# Patient Record
Sex: Male | Born: 1950 | Race: White | Hispanic: No | Marital: Married | Smoking: Never smoker
Health system: Southern US, Community
[De-identification: ages and names within clinical notes are randomized; demographics above are authoritative.]

## PROBLEM LIST (undated history)

## (undated) DIAGNOSIS — R918 Other nonspecific abnormal finding of lung field: Secondary | ICD-10-CM

## (undated) DIAGNOSIS — M5135 Other intervertebral disc degeneration, thoracolumbar region: Secondary | ICD-10-CM

## (undated) DIAGNOSIS — I82409 Acute embolism and thrombosis of unspecified deep veins of unspecified lower extremity: Secondary | ICD-10-CM

## (undated) DIAGNOSIS — G4733 Obstructive sleep apnea (adult) (pediatric): Secondary | ICD-10-CM

## (undated) DIAGNOSIS — I951 Orthostatic hypotension: Secondary | ICD-10-CM

## (undated) DIAGNOSIS — M549 Dorsalgia, unspecified: Secondary | ICD-10-CM

## (undated) DIAGNOSIS — M5412 Radiculopathy, cervical region: Secondary | ICD-10-CM

## (undated) DIAGNOSIS — G4730 Sleep apnea, unspecified: Secondary | ICD-10-CM

## (undated) DIAGNOSIS — R7309 Other abnormal glucose: Secondary | ICD-10-CM

## (undated) DIAGNOSIS — Z87438 Personal history of other diseases of male genital organs: Secondary | ICD-10-CM

## (undated) DIAGNOSIS — Z86718 Personal history of other venous thrombosis and embolism: Secondary | ICD-10-CM

## (undated) DIAGNOSIS — I739 Peripheral vascular disease, unspecified: Secondary | ICD-10-CM

## (undated) DIAGNOSIS — J189 Pneumonia, unspecified organism: Secondary | ICD-10-CM

## (undated) DIAGNOSIS — N401 Enlarged prostate with lower urinary tract symptoms: Secondary | ICD-10-CM

## (undated) DIAGNOSIS — R319 Hematuria, unspecified: Secondary | ICD-10-CM

## (undated) DIAGNOSIS — H8193 Unspecified disorder of vestibular function, bilateral: Secondary | ICD-10-CM

## (undated) DIAGNOSIS — Z974 Presence of external hearing-aid: Secondary | ICD-10-CM

## (undated) DIAGNOSIS — Z87442 Personal history of urinary calculi: Secondary | ICD-10-CM

## (undated) DIAGNOSIS — Z973 Presence of spectacles and contact lenses: Secondary | ICD-10-CM

## (undated) DIAGNOSIS — N400 Enlarged prostate without lower urinary tract symptoms: Secondary | ICD-10-CM

## (undated) DIAGNOSIS — E785 Hyperlipidemia, unspecified: Secondary | ICD-10-CM

## (undated) DIAGNOSIS — N419 Inflammatory disease of prostate, unspecified: Secondary | ICD-10-CM

## (undated) DIAGNOSIS — Z8489 Family history of other specified conditions: Secondary | ICD-10-CM

## (undated) DIAGNOSIS — Z7901 Long term (current) use of anticoagulants: Secondary | ICD-10-CM

## (undated) DIAGNOSIS — M1990 Unspecified osteoarthritis, unspecified site: Secondary | ICD-10-CM

## (undated) HISTORY — DX: Benign prostatic hyperplasia without lower urinary tract symptoms: N40.0

## (undated) HISTORY — DX: Dorsalgia, unspecified: M54.9

## (undated) HISTORY — DX: Hyperlipidemia, unspecified: E78.5

## (undated) HISTORY — PX: CERVICAL SPINE SURGERY: SHX589

## (undated) HISTORY — DX: Radiculopathy, cervical region: M54.12

## (undated) HISTORY — PX: BACK SURGERY: SHX140

## (undated) HISTORY — PX: CYSTOSCOPY W/ URETERAL STENT PLACEMENT: SHX1429

## (undated) HISTORY — PX: OTHER SURGICAL HISTORY: SHX169

## (undated) HISTORY — DX: Acute embolism and thrombosis of unspecified deep veins of unspecified lower extremity: I82.409

## (undated) HISTORY — DX: Other abnormal glucose: R73.09

---

## 1999-02-26 HISTORY — PX: LUMBAR FUSION: SHX111

## 2004-03-28 HISTORY — PX: LUMBAR DISC SURGERY: SHX700

## 2008-03-28 HISTORY — PX: ANTERIOR CERVICAL DECOMP/DISCECTOMY FUSION: SHX1161

## 2016-05-23 DIAGNOSIS — M4696 Unspecified inflammatory spondylopathy, lumbar region: Secondary | ICD-10-CM | POA: Diagnosis not present

## 2016-05-23 DIAGNOSIS — Z23 Encounter for immunization: Secondary | ICD-10-CM | POA: Diagnosis not present

## 2016-05-23 DIAGNOSIS — N400 Enlarged prostate without lower urinary tract symptoms: Secondary | ICD-10-CM | POA: Diagnosis not present

## 2016-05-23 DIAGNOSIS — Z79899 Other long term (current) drug therapy: Secondary | ICD-10-CM | POA: Diagnosis not present

## 2016-05-23 DIAGNOSIS — Z7901 Long term (current) use of anticoagulants: Secondary | ICD-10-CM | POA: Diagnosis not present

## 2016-05-23 DIAGNOSIS — I82439 Acute embolism and thrombosis of unspecified popliteal vein: Secondary | ICD-10-CM | POA: Diagnosis not present

## 2016-06-22 DIAGNOSIS — L578 Other skin changes due to chronic exposure to nonionizing radiation: Secondary | ICD-10-CM | POA: Diagnosis not present

## 2016-06-22 DIAGNOSIS — D224 Melanocytic nevi of scalp and neck: Secondary | ICD-10-CM | POA: Diagnosis not present

## 2016-06-22 DIAGNOSIS — X32XXXS Exposure to sunlight, sequela: Secondary | ICD-10-CM | POA: Diagnosis not present

## 2016-06-22 DIAGNOSIS — L821 Other seborrheic keratosis: Secondary | ICD-10-CM | POA: Diagnosis not present

## 2016-06-22 DIAGNOSIS — D225 Melanocytic nevi of trunk: Secondary | ICD-10-CM | POA: Diagnosis not present

## 2016-06-22 DIAGNOSIS — D485 Neoplasm of uncertain behavior of skin: Secondary | ICD-10-CM | POA: Diagnosis not present

## 2016-08-18 DIAGNOSIS — Z7901 Long term (current) use of anticoagulants: Secondary | ICD-10-CM | POA: Diagnosis not present

## 2016-08-18 DIAGNOSIS — R351 Nocturia: Secondary | ICD-10-CM | POA: Diagnosis not present

## 2016-08-18 DIAGNOSIS — M47896 Other spondylosis, lumbar region: Secondary | ICD-10-CM | POA: Diagnosis not present

## 2016-08-18 DIAGNOSIS — M4696 Unspecified inflammatory spondylopathy, lumbar region: Secondary | ICD-10-CM | POA: Diagnosis not present

## 2016-08-18 DIAGNOSIS — M47812 Spondylosis without myelopathy or radiculopathy, cervical region: Secondary | ICD-10-CM | POA: Diagnosis not present

## 2016-08-18 DIAGNOSIS — Z79899 Other long term (current) drug therapy: Secondary | ICD-10-CM | POA: Diagnosis not present

## 2016-08-18 DIAGNOSIS — M47816 Spondylosis without myelopathy or radiculopathy, lumbar region: Secondary | ICD-10-CM | POA: Diagnosis not present

## 2016-08-18 DIAGNOSIS — I82439 Acute embolism and thrombosis of unspecified popliteal vein: Secondary | ICD-10-CM | POA: Diagnosis not present

## 2016-11-09 DIAGNOSIS — H0015 Chalazion left lower eyelid: Secondary | ICD-10-CM | POA: Diagnosis not present

## 2016-11-23 DIAGNOSIS — M1990 Unspecified osteoarthritis, unspecified site: Secondary | ICD-10-CM | POA: Diagnosis not present

## 2016-11-23 DIAGNOSIS — Z79899 Other long term (current) drug therapy: Secondary | ICD-10-CM | POA: Diagnosis not present

## 2016-11-23 DIAGNOSIS — I82621 Acute embolism and thrombosis of deep veins of right upper extremity: Secondary | ICD-10-CM | POA: Diagnosis not present

## 2016-11-23 DIAGNOSIS — K640 First degree hemorrhoids: Secondary | ICD-10-CM | POA: Diagnosis not present

## 2016-11-23 DIAGNOSIS — K219 Gastro-esophageal reflux disease without esophagitis: Secondary | ICD-10-CM | POA: Diagnosis not present

## 2016-11-23 DIAGNOSIS — I8000 Phlebitis and thrombophlebitis of superficial vessels of unspecified lower extremity: Secondary | ICD-10-CM | POA: Diagnosis not present

## 2016-11-23 DIAGNOSIS — M4696 Unspecified inflammatory spondylopathy, lumbar region: Secondary | ICD-10-CM | POA: Diagnosis not present

## 2016-11-23 DIAGNOSIS — M47816 Spondylosis without myelopathy or radiculopathy, lumbar region: Secondary | ICD-10-CM | POA: Diagnosis not present

## 2016-11-23 DIAGNOSIS — Z7901 Long term (current) use of anticoagulants: Secondary | ICD-10-CM | POA: Diagnosis not present

## 2016-11-23 DIAGNOSIS — I82439 Acute embolism and thrombosis of unspecified popliteal vein: Secondary | ICD-10-CM | POA: Diagnosis not present

## 2016-11-23 DIAGNOSIS — M4802 Spinal stenosis, cervical region: Secondary | ICD-10-CM | POA: Diagnosis not present

## 2016-11-23 DIAGNOSIS — N400 Enlarged prostate without lower urinary tract symptoms: Secondary | ICD-10-CM | POA: Diagnosis not present

## 2016-12-24 DIAGNOSIS — R319 Hematuria, unspecified: Secondary | ICD-10-CM | POA: Diagnosis not present

## 2016-12-24 DIAGNOSIS — N390 Urinary tract infection, site not specified: Secondary | ICD-10-CM | POA: Diagnosis not present

## 2016-12-24 DIAGNOSIS — Z7901 Long term (current) use of anticoagulants: Secondary | ICD-10-CM | POA: Diagnosis not present

## 2016-12-24 DIAGNOSIS — I82621 Acute embolism and thrombosis of deep veins of right upper extremity: Secondary | ICD-10-CM | POA: Diagnosis not present

## 2016-12-24 DIAGNOSIS — N400 Enlarged prostate without lower urinary tract symptoms: Secondary | ICD-10-CM | POA: Diagnosis not present

## 2016-12-26 DIAGNOSIS — N200 Calculus of kidney: Secondary | ICD-10-CM | POA: Diagnosis not present

## 2016-12-28 DIAGNOSIS — R310 Gross hematuria: Secondary | ICD-10-CM | POA: Diagnosis not present

## 2016-12-28 DIAGNOSIS — R3912 Poor urinary stream: Secondary | ICD-10-CM | POA: Diagnosis not present

## 2016-12-28 DIAGNOSIS — N400 Enlarged prostate without lower urinary tract symptoms: Secondary | ICD-10-CM | POA: Diagnosis not present

## 2016-12-28 DIAGNOSIS — R319 Hematuria, unspecified: Secondary | ICD-10-CM | POA: Diagnosis not present

## 2017-11-09 DIAGNOSIS — N400 Enlarged prostate without lower urinary tract symptoms: Secondary | ICD-10-CM | POA: Diagnosis not present

## 2017-11-09 DIAGNOSIS — Z86718 Personal history of other venous thrombosis and embolism: Secondary | ICD-10-CM | POA: Diagnosis not present

## 2017-11-09 DIAGNOSIS — M5412 Radiculopathy, cervical region: Secondary | ICD-10-CM | POA: Diagnosis not present

## 2017-11-09 DIAGNOSIS — M549 Dorsalgia, unspecified: Secondary | ICD-10-CM | POA: Diagnosis not present

## 2017-11-16 ENCOUNTER — Other Ambulatory Visit: Payer: Self-pay | Admitting: Family Medicine

## 2017-11-16 DIAGNOSIS — M5412 Radiculopathy, cervical region: Secondary | ICD-10-CM

## 2017-11-29 ENCOUNTER — Ambulatory Visit
Admission: RE | Admit: 2017-11-29 | Discharge: 2017-11-29 | Disposition: A | Discharge status: Home / Self Care | Payer: Medicare Other | Source: Ambulatory Visit | Attending: Family Medicine | Admitting: Family Medicine

## 2017-11-29 DIAGNOSIS — M5412 Radiculopathy, cervical region: Secondary | ICD-10-CM

## 2017-11-29 DIAGNOSIS — M4802 Spinal stenosis, cervical region: Secondary | ICD-10-CM | POA: Diagnosis not present

## 2017-12-18 DIAGNOSIS — Z6828 Body mass index (BMI) 28.0-28.9, adult: Secondary | ICD-10-CM | POA: Diagnosis not present

## 2017-12-18 DIAGNOSIS — M5412 Radiculopathy, cervical region: Secondary | ICD-10-CM | POA: Diagnosis not present

## 2017-12-18 DIAGNOSIS — R030 Elevated blood-pressure reading, without diagnosis of hypertension: Secondary | ICD-10-CM | POA: Diagnosis not present

## 2017-12-21 DIAGNOSIS — M5412 Radiculopathy, cervical region: Secondary | ICD-10-CM | POA: Diagnosis not present

## 2018-01-01 DIAGNOSIS — M5412 Radiculopathy, cervical region: Secondary | ICD-10-CM | POA: Diagnosis not present

## 2018-01-01 DIAGNOSIS — M79602 Pain in left arm: Secondary | ICD-10-CM | POA: Diagnosis not present

## 2018-01-05 DIAGNOSIS — M79602 Pain in left arm: Secondary | ICD-10-CM | POA: Diagnosis not present

## 2018-01-05 DIAGNOSIS — M5412 Radiculopathy, cervical region: Secondary | ICD-10-CM | POA: Diagnosis not present

## 2018-01-11 DIAGNOSIS — M79602 Pain in left arm: Secondary | ICD-10-CM | POA: Diagnosis not present

## 2018-01-11 DIAGNOSIS — M5412 Radiculopathy, cervical region: Secondary | ICD-10-CM | POA: Diagnosis not present

## 2018-01-16 DIAGNOSIS — M5412 Radiculopathy, cervical region: Secondary | ICD-10-CM | POA: Diagnosis not present

## 2018-01-16 DIAGNOSIS — M79602 Pain in left arm: Secondary | ICD-10-CM | POA: Diagnosis not present

## 2018-01-18 DIAGNOSIS — M5412 Radiculopathy, cervical region: Secondary | ICD-10-CM | POA: Diagnosis not present

## 2018-01-18 DIAGNOSIS — M79602 Pain in left arm: Secondary | ICD-10-CM | POA: Diagnosis not present

## 2018-01-19 DIAGNOSIS — Z125 Encounter for screening for malignant neoplasm of prostate: Secondary | ICD-10-CM | POA: Diagnosis not present

## 2018-01-19 DIAGNOSIS — M5412 Radiculopathy, cervical region: Secondary | ICD-10-CM | POA: Diagnosis not present

## 2018-01-19 DIAGNOSIS — Z0000 Encounter for general adult medical examination without abnormal findings: Secondary | ICD-10-CM | POA: Diagnosis not present

## 2018-01-19 DIAGNOSIS — E785 Hyperlipidemia, unspecified: Secondary | ICD-10-CM | POA: Diagnosis not present

## 2018-01-19 DIAGNOSIS — Z86718 Personal history of other venous thrombosis and embolism: Secondary | ICD-10-CM | POA: Diagnosis not present

## 2018-01-19 DIAGNOSIS — Z23 Encounter for immunization: Secondary | ICD-10-CM | POA: Diagnosis not present

## 2018-01-19 DIAGNOSIS — N400 Enlarged prostate without lower urinary tract symptoms: Secondary | ICD-10-CM | POA: Diagnosis not present

## 2018-01-19 DIAGNOSIS — L309 Dermatitis, unspecified: Secondary | ICD-10-CM | POA: Diagnosis not present

## 2018-01-23 DIAGNOSIS — M79602 Pain in left arm: Secondary | ICD-10-CM | POA: Diagnosis not present

## 2018-01-23 DIAGNOSIS — M5412 Radiculopathy, cervical region: Secondary | ICD-10-CM | POA: Diagnosis not present

## 2018-01-25 DIAGNOSIS — M5412 Radiculopathy, cervical region: Secondary | ICD-10-CM | POA: Diagnosis not present

## 2018-01-25 DIAGNOSIS — M79602 Pain in left arm: Secondary | ICD-10-CM | POA: Diagnosis not present

## 2018-02-07 DIAGNOSIS — M5412 Radiculopathy, cervical region: Secondary | ICD-10-CM | POA: Diagnosis not present

## 2018-02-12 DIAGNOSIS — Z6828 Body mass index (BMI) 28.0-28.9, adult: Secondary | ICD-10-CM | POA: Diagnosis not present

## 2018-02-12 DIAGNOSIS — M5412 Radiculopathy, cervical region: Secondary | ICD-10-CM | POA: Diagnosis not present

## 2018-02-12 DIAGNOSIS — R030 Elevated blood-pressure reading, without diagnosis of hypertension: Secondary | ICD-10-CM | POA: Diagnosis not present

## 2018-02-20 DIAGNOSIS — R030 Elevated blood-pressure reading, without diagnosis of hypertension: Secondary | ICD-10-CM | POA: Diagnosis not present

## 2018-02-20 DIAGNOSIS — R079 Chest pain, unspecified: Secondary | ICD-10-CM | POA: Diagnosis not present

## 2018-03-02 DIAGNOSIS — N410 Acute prostatitis: Secondary | ICD-10-CM | POA: Diagnosis not present

## 2018-03-05 DIAGNOSIS — R509 Fever, unspecified: Secondary | ICD-10-CM | POA: Diagnosis not present

## 2018-03-10 DIAGNOSIS — R509 Fever, unspecified: Secondary | ICD-10-CM | POA: Diagnosis not present

## 2018-03-10 DIAGNOSIS — R05 Cough: Secondary | ICD-10-CM | POA: Diagnosis not present

## 2018-03-16 ENCOUNTER — Other Ambulatory Visit: Payer: Self-pay | Admitting: Family Medicine

## 2018-03-16 DIAGNOSIS — R350 Frequency of micturition: Secondary | ICD-10-CM | POA: Diagnosis not present

## 2018-03-16 DIAGNOSIS — R5383 Other fatigue: Secondary | ICD-10-CM | POA: Diagnosis not present

## 2018-03-16 DIAGNOSIS — R509 Fever, unspecified: Secondary | ICD-10-CM

## 2018-03-16 DIAGNOSIS — R05 Cough: Secondary | ICD-10-CM | POA: Diagnosis not present

## 2018-03-16 DIAGNOSIS — R059 Cough, unspecified: Secondary | ICD-10-CM

## 2018-03-19 ENCOUNTER — Ambulatory Visit
Admission: RE | Admit: 2018-03-19 | Discharge: 2018-03-19 | Disposition: A | Discharge status: Home / Self Care | Payer: Medicare Other | Source: Ambulatory Visit | Attending: Family Medicine | Admitting: Family Medicine

## 2018-03-19 ENCOUNTER — Other Ambulatory Visit: Payer: Medicare Other

## 2018-03-19 DIAGNOSIS — R059 Cough, unspecified: Secondary | ICD-10-CM

## 2018-03-19 DIAGNOSIS — R05 Cough: Secondary | ICD-10-CM

## 2018-03-19 DIAGNOSIS — K769 Liver disease, unspecified: Secondary | ICD-10-CM | POA: Diagnosis not present

## 2018-03-19 DIAGNOSIS — J90 Pleural effusion, not elsewhere classified: Secondary | ICD-10-CM | POA: Diagnosis not present

## 2018-03-19 DIAGNOSIS — R509 Fever, unspecified: Secondary | ICD-10-CM

## 2018-03-19 MED ORDER — IOPAMIDOL (ISOVUE-300) INJECTION 61%
100.0000 mL | Freq: Once | INTRAVENOUS | Status: AC | PRN
  Administered 2018-03-19: 100 mL via INTRAVENOUS

## 2018-03-22 DIAGNOSIS — R918 Other nonspecific abnormal finding of lung field: Secondary | ICD-10-CM | POA: Diagnosis not present

## 2018-03-23 ENCOUNTER — Other Ambulatory Visit: Payer: Self-pay

## 2018-03-23 ENCOUNTER — Encounter (HOSPITAL_COMMUNITY): Payer: Self-pay | Admitting: *Deleted

## 2018-03-23 ENCOUNTER — Emergency Department (HOSPITAL_COMMUNITY)
Admission: EM | Admit: 2018-03-23 | Discharge: 2018-03-24 | Disposition: A | Discharge status: Home / Self Care | Payer: Medicare Other | Attending: Emergency Medicine | Admitting: Emergency Medicine

## 2018-03-23 ENCOUNTER — Emergency Department (HOSPITAL_COMMUNITY): Payer: Medicare Other

## 2018-03-23 DIAGNOSIS — R071 Chest pain on breathing: Secondary | ICD-10-CM | POA: Diagnosis not present

## 2018-03-23 DIAGNOSIS — J181 Lobar pneumonia, unspecified organism: Secondary | ICD-10-CM | POA: Diagnosis not present

## 2018-03-23 DIAGNOSIS — J90 Pleural effusion, not elsewhere classified: Secondary | ICD-10-CM | POA: Diagnosis not present

## 2018-03-23 DIAGNOSIS — R0602 Shortness of breath: Secondary | ICD-10-CM | POA: Diagnosis not present

## 2018-03-23 DIAGNOSIS — R918 Other nonspecific abnormal finding of lung field: Secondary | ICD-10-CM | POA: Diagnosis not present

## 2018-03-23 DIAGNOSIS — R351 Nocturia: Secondary | ICD-10-CM | POA: Diagnosis not present

## 2018-03-23 DIAGNOSIS — R0781 Pleurodynia: Secondary | ICD-10-CM

## 2018-03-23 DIAGNOSIS — R3912 Poor urinary stream: Secondary | ICD-10-CM | POA: Diagnosis not present

## 2018-03-23 DIAGNOSIS — R350 Frequency of micturition: Secondary | ICD-10-CM | POA: Diagnosis not present

## 2018-03-23 HISTORY — DX: Inflammatory disease of prostate, unspecified: N41.9

## 2018-03-23 HISTORY — DX: Other nonspecific abnormal finding of lung field: R91.8

## 2018-03-23 NOTE — ED Triage Notes (Addendum)
Pt reports sharp shooting pain in his R lower side yesterday then it moved on the L side.  Today, he woke up with a severe sharp shooting pain in his R chest, which is worse with inspiration.  Denies dizziness.  He reports having a CT of his chest and was found to have nodules in bila lungs.  Pending appt with a pulmonologist.  He never smoked.  He also endorses a low-grade fever of 100.3 today.

## 2018-03-24 ENCOUNTER — Emergency Department (HOSPITAL_COMMUNITY): Payer: Medicare Other

## 2018-03-24 ENCOUNTER — Encounter (HOSPITAL_COMMUNITY): Payer: Self-pay

## 2018-03-24 DIAGNOSIS — R0602 Shortness of breath: Secondary | ICD-10-CM | POA: Diagnosis not present

## 2018-03-24 DIAGNOSIS — R0781 Pleurodynia: Secondary | ICD-10-CM | POA: Diagnosis not present

## 2018-03-24 LAB — CBC
HCT: 39.9 % (ref 39.0–52.0)
Hemoglobin: 12.8 g/dL — ABNORMAL LOW (ref 13.0–17.0)
MCH: 30.3 pg (ref 26.0–34.0)
MCHC: 32.1 g/dL (ref 30.0–36.0)
MCV: 94.5 fL (ref 80.0–100.0)
NRBC: 0 % (ref 0.0–0.2)
Platelets: 405 10*3/uL — ABNORMAL HIGH (ref 150–400)
RBC: 4.22 MIL/uL (ref 4.22–5.81)
RDW: 11.9 % (ref 11.5–15.5)
WBC: 10.2 10*3/uL (ref 4.0–10.5)

## 2018-03-24 LAB — BASIC METABOLIC PANEL
Anion gap: 9 (ref 5–15)
BUN: 20 mg/dL (ref 8–23)
CO2: 26 mmol/L (ref 22–32)
CREATININE: 1.06 mg/dL (ref 0.61–1.24)
Calcium: 9.3 mg/dL (ref 8.9–10.3)
Chloride: 98 mmol/L (ref 98–111)
GFR calc Af Amer: >60 mL/min (ref >60)
GFR calc non Af Amer: >60 mL/min (ref >60)
Glucose, Bld: 120 mg/dL — ABNORMAL HIGH (ref 70–99)
Potassium: 4.2 mmol/L (ref 3.5–5.1)
Sodium: 133 mmol/L — ABNORMAL LOW (ref 135–145)

## 2018-03-24 LAB — POCT I-STAT TROPONIN I: Troponin i, poc: 0 ng/mL (ref 0.00–0.08)

## 2018-03-24 MED ORDER — IOPAMIDOL (ISOVUE-300) INJECTION 61%: 100.0000 mL | Freq: Once | INTRAVENOUS | Status: DC | PRN

## 2018-03-24 MED ORDER — HYDROCODONE-ACETAMINOPHEN 5-325 MG PO TABS
2.0000 | ORAL_TABLET | Freq: Once | ORAL | Status: AC
  Administered 2018-03-24: 2 via ORAL
  Filled 2018-03-24: qty 2

## 2018-03-24 MED ORDER — IOPAMIDOL (ISOVUE-370) INJECTION 76%
INTRAVENOUS | Status: AC
  Filled 2018-03-24: qty 100

## 2018-03-24 MED ORDER — IOPAMIDOL (ISOVUE-370) INJECTION 76%
100.0000 mL | Freq: Once | INTRAVENOUS | Status: AC | PRN
  Administered 2018-03-24: 100 mL via INTRAVENOUS

## 2018-03-24 MED ORDER — SODIUM CHLORIDE (PF) 0.9 % IJ SOLN
INTRAMUSCULAR | Status: AC
  Filled 2018-03-24: qty 50

## 2018-03-24 MED ORDER — HYDROCODONE-ACETAMINOPHEN 5-325 MG PO TABS: 1.0000 | ORAL_TABLET | Freq: Four times a day (QID) | ORAL | 0 refills | Status: DC | PRN

## 2018-03-24 NOTE — Discharge Instructions (Addendum)
Take Norco as prescribed. Keep your scheduled appointment with pulmonology for next week. Return to the ED with any high fever, severe pain, significant shortness of breath or new concern.

## 2018-03-24 NOTE — ED Provider Notes (Signed)
[facility] DEPT Provider Note   CSN: [id number] Arrival date & time: 03/23/18  2018     History   Chief Complaint Chief Complaint  Patient presents with  . Chest Pain    HPI Craig [name] is a 67 y.o. male.  Patient to ED with pain in right and left lower chest since yesterday, worse with taking breaths. He reports a low grade fever for the past 3 weeks and a mild nonproductive cough. No abdominal pain, nausea, vomiting. He had a CT chest on 03/19/18 that show findings concerning for metastatic disease. He has his first pulmonology appointment in 3 days. No recent travel. 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   The history is provided by the patient. No language interpreter was used.  Chest Pain   Associated symptoms include cough (Mild, dry, more when taking a deep breath) and a fever (See HPI.). Pertinent negatives include no abdominal pain, no dizziness, no nausea, no shortness of breath and no weakness.    Past Medical History:  Diagnosis Date  . Multiple lung nodules on CT   . Prostatitis     There are no active problems to display for this patient.   Past Surgical History:  Procedure Laterality Date  . BACK SURGERY    . CERVICAL SPINE SURGERY    . urinary stent Left         Home Medications    Prior to Admission medications   Not on File    Family History No family history on file.  Social History Social History   Tobacco Use  . Smoking status: Never Smoker  . Smokeless tobacco: Never Used  Substance Use Topics  . Alcohol use: Yes    Comment: occa  . Drug use: Never     Allergies   Gentamycin [gentamicin]; Nsaids; and Vancomycin   Review of Systems Review of Systems  Constitutional: Positive for fever (See HPI.).  Respiratory: Positive for cough (Mild, dry, more when taking a deep breath). Negative for shortness of breath and wheezing.   Cardiovascular: Positive  for chest pain.  Gastrointestinal: Negative for abdominal pain and nausea.  Musculoskeletal: Negative for myalgias.  Neurological: Negative for dizziness, syncope and weakness.     Physical Exam Updated Vital Signs BP 133/86 (BP Location: Right Arm)   Pulse 96   Temp 99.3 F (37.4 C) (Oral)   Resp (!) 26   Ht 5\' 10"  (1.778 m)   Wt 86.2 kg   SpO2 93%   BMI 27.26 kg/m   Physical Exam Vitals signs and nursing note reviewed.  Constitutional:      Appearance: He is well-developed.  HENT:     Head: Normocephalic.  Neck:     Musculoskeletal: Normal range of motion and neck supple.  Cardiovascular:     Rate and Rhythm: Regular rhythm. Tachycardia present.     Heart sounds: No murmur.  Pulmonary:     Effort: Pulmonary effort is normal. Tachypnea present.     Breath sounds: Normal breath sounds. No decreased breath sounds, wheezing, rhonchi or rales.  Chest:     Chest wall: No tenderness.  Abdominal:     General: Bowel sounds are normal.     Palpations: Abdomen is soft.     Tenderness: There is no abdominal tenderness. There is no guarding or rebound.  Musculoskeletal: Normal range of motion.        General: No tenderness.  Right lower leg: No edema.     Left lower leg: No edema.  Skin:    General: Skin is warm and dry.     Findings: No rash.  Neurological:     Mental Status: He is alert and oriented to person, place, and time.      ED Treatments / Results  Labs (all labs ordered are listed, but only abnormal results are displayed) Labs Reviewed  BASIC METABOLIC PANEL  CBC  I-STAT TROPONIN, ED  POCT I-STAT TROPONIN I    EKG EKG Interpretation  Date/Time:  Friday March 23 2018 20:51:04 EST Ventricular Rate:  101 PR Interval:    QRS Duration: 94 QT Interval:  314 QTC Calculation: 407 R Axis:   77 Text Interpretation:  Sinus tachycardia Probable left atrial enlargement No old tracing to compare Confirmed by Duffy Bruce [phone]) on 03/24/2018  12:16:42 AM   Radiology Dg Chest 2 View  Addendum Date: 03/23/2018   ADDENDUM REPORT: 03/23/2018 23:47 ADDENDUM: The chest x-ray from today is compare with prior CT chest from March 19, 2018. The consolidation of right lung base with pleural effusion is not significantly changed compared to prior CT noted masslike area. There is at masslike lesion in the medial left lung base which is also unchanged compared to prior CT scan. Electronically Signed   By: Abelardo Diesel M.D.   On: 03/23/2018 23:47   Result Date: 03/23/2018 CLINICAL DATA:  Hervey Ard shooting right chest pain since yesterday EXAM: CHEST - 2 VIEW COMPARISON:  March 10, 2018 FINDINGS: The heart size and mediastinal contours are within normal limits. Small right pleural effusion is identified. Patchy consolidation of right lung base is noted. The left lung is clear. The visualized skeletal structures are unremarkable. IMPRESSION: Right lung base pneumonia with small right pleural effusion. Electronically Signed: By: Abelardo Diesel M.D. On: 03/23/2018 21:05    Procedures Procedures (including critical care time)  Medications Ordered in ED Medications - No data to display   Initial Impression / Assessment and Plan / ED Course  I have reviewed the triage vital signs and the nursing notes.  Pertinent labs & imaging results that were available during my care of the patient were reviewed by me and considered in my medical decision making (see chart for details).     Patient to ED for evaluation of sharp, bilateral, pleuritic, lower chest pain since yesterday. Recent CT chest finding of RLL mass, right pleural effusion and multiple nodules concerning for CA. History of DVT on Xarelto.   He appears well and in NAD. He is, however, tachycardic, tachypneic with pleuritic chest pain and history of clots. He is anticoagulated on Xarelto. Feel CTA eval for PE is reasonable given risk factors despite Xarelto use.   CTA performed and there  are no PE's present. Essentially unchanged from prior CT reading. He has had no acute fever, no leukocytosis. Doubt infectious component to chest pain. Norco provided with significant relief. He ambulates well, without difficulty or hypoxia.   Will discharge home with Rx Norco as symptoms felt related to recent finding of lung mass and nodules suspicious for CA. He has scheduled follow up next week for further evaluation and determination of definite diagnosis. He is comfortable with plan of discharge and knows to return with any new or concerning symptoms.  Final Clinical Impressions(s) / ED Diagnoses   Final diagnoses:  None   1. Chest wall pain 2. Lung mass  ED Discharge Orders    None  Charlann Lange, PA-C 03/24/18 [id number]    Duffy Bruce, MD 03/25/18 1710

## 2018-03-26 ENCOUNTER — Ambulatory Visit (INDEPENDENT_AMBULATORY_CARE_PROVIDER_SITE_OTHER): Payer: Medicare Other | Admitting: Pulmonary Disease

## 2018-03-26 ENCOUNTER — Encounter: Payer: Self-pay | Admitting: Pulmonary Disease

## 2018-03-26 VITALS — BP 106/72 | HR 90 | Ht 70.0 in | Wt 187.0 lb

## 2018-03-26 DIAGNOSIS — R911 Solitary pulmonary nodule: Secondary | ICD-10-CM | POA: Diagnosis not present

## 2018-03-26 DIAGNOSIS — R9389 Abnormal findings on diagnostic imaging of other specified body structures: Secondary | ICD-10-CM

## 2018-03-26 DIAGNOSIS — R059 Cough, unspecified: Secondary | ICD-10-CM

## 2018-03-26 DIAGNOSIS — R05 Cough: Secondary | ICD-10-CM | POA: Diagnosis not present

## 2018-03-26 NOTE — Progress Notes (Signed)
Craig [name]    [id number]    [birth year]  Primary Care Physician:Morrow, Marjory Lies, MD  Referring Physician: London Pepper, MD [address] [place], [place] [address]  Chief complaint:  Abnormal CT scan of the chest showing a right right lower lobe mass lesion, pleural-based Small nodules on the left  HPI:  Usually healthy Started with febrile illness about December 3 Treated with a course of antibiotics for possible UTI With nonresolution of symptoms and development of respiratory symptoms He was started on another course of antibiotics In total is used 3 courses of antibiotics He is no longer having fevers but still feels a little bit under the weather  He did start having some pleuritic chest pains Has lost about 13 pounds since the onset of symptoms-despite an adequate appetite  Had a CT scan of the chest showing multiple nodules, a mass at the right base noted on CT with associated small effusion  Occupation: Was in the [employer] 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 acetaminophen (TYLENOL) 500 MG tablet Take 500 mg by mouth every 6 (six) hours as needed for mild pain or moderate pain.  Marland Kitchen HYDROcodone-acetaminophen (NORCO/VICODIN) 5-325 MG tablet Take 1-2 tablets by mouth every 6 (six) hours as needed for moderate pain or severe pain.  Marland Kitchen levofloxacin (LEVAQUIN) 500 MG tablet Take 500 mg by mouth daily. Once daily for 28 days beginning 03-17-18.  . rivaroxaban (XARELTO) 20 MG TABS tablet Take 20 mg by mouth daily with supper.  . tamsulosin (FLOMAX) 0.4 MG CAPS capsule Take 0.8 mg by mouth daily.  . cyclobenzaprine (FLEXERIL) 10 MG tablet Take 10 mg by mouth 3 (three) times daily as needed for muscle spasms.  Marland Kitchen glucosamine-chondroitin 500-400 MG tablet Take 2 tablets by mouth daily.   No facility-administered encounter medications on file as of 03/26/2018.      Allergies as of 03/26/2018 - Review Complete 03/26/2018  Allergen Reaction Noted  . Gentamycin [gentamicin] Other (See Comments) 03/23/2018  . Nsaids  03/23/2018  . Vancomycin Rash 03/23/2018    Past Medical History:  Diagnosis Date  . Multiple lung nodules on CT   . Prostatitis     Past Surgical History:  Procedure Laterality Date  . BACK SURGERY    . CERVICAL SPINE SURGERY    . urinary stent Left     Family History  Problem Relation Age of Onset  . Colon cancer Mother     Social History   Socioeconomic History  . Marital status: Married    Spouse name: Not on file  . Number of children: Not on file  . Years of education: Not on file  . Highest education level: Not on file  Occupational History  . Not on file  Social Needs  . Financial resource strain: Not on file  . Food insecurity:    Worry: Not on file    Inability: Not on file  . Transportation needs:    Medical: Not on file    Non-medical: Not on file  Tobacco Use  . Smoking status: Never Smoker  . Smokeless tobacco: Never Used  Substance and Sexual Activity  . Alcohol use: Yes    Comment: occa  . Drug use: Never  . Sexual activity: Not on file  Lifestyle  . Physical activity:    Days per week: Not on file    Minutes  per session: Not on file  . Stress: Not on file  Relationships  . Social connections:    Talks on phone: Not on file    Gets together: Not on file    Attends religious service: Not on file    Active member of club or organization: Not on file    Attends meetings of clubs or organizations: Not on file    Relationship status: Not on file  . Intimate partner violence:    Fear of current or ex partner: Not on file    Emotionally abused: Not on file    Physically abused: Not on file    Forced sexual activity: Not on file  Other Topics Concern  . Not on file  Social History Narrative  . Not on file    Review of Systems  Constitutional: Positive for fatigue.  HENT:  Negative.   Eyes: Negative.   Respiratory: Positive for cough. Negative for choking, chest tightness, shortness of breath, wheezing and stridor.   Cardiovascular: Negative.   Gastrointestinal: Negative.   Endocrine: Negative.     Vitals:   03/26/18 1009  BP: 106/72  Pulse: 90  SpO2: 93%     Physical Exam  Constitutional: He is oriented to person, place, and time. He appears well-developed and well-nourished.  HENT:  Head: Normocephalic and atraumatic.  Eyes: Pupils are equal, round, and reactive to light. Conjunctivae and EOM are normal. Right eye exhibits no discharge. Left eye exhibits no discharge.  Neck: Normal range of motion. Neck supple. No tracheal deviation present. No thyromegaly present.  Cardiovascular: Normal rate and regular rhythm. Exam reveals no friction rub.  No murmur heard. Pulmonary/Chest: Effort normal and breath sounds normal. No respiratory distress. He has no wheezes.  Abdominal: Soft. Bowel sounds are normal. He exhibits no distension. There is no abdominal tenderness.  Musculoskeletal: Normal range of motion.  Neurological: He is alert and oriented to person, place, and time. No cranial nerve deficit.  Skin: Skin is warm and dry. No erythema.  Psychiatric: He has a normal mood and affect.   Data Reviewed: Recent chest x-ray from 1214 reviewed with the patient Recent CT scan reviewed with the patient showing pleural effusion, mass in the right lower lobe, multiple nodules on the left  Assessment:  Abnormal CT scan of the chest -Right lower lobe mass -Multiple nodules left lower lobe  Recent weight loss -Likely related to recent illness  Recent febrile illness -May be related to postobstructive process  Past history of DVT -On Xarelto  Findings on CT is concerning for a neoplastic process -No previous CTs to compare current one with, no recent chest x-rays to compare current one read as well The nature of the finding on CT is concerning for  a mass lesion rather than an infectious process  Infectious processes will still remain on the differential however less likely so -He is currently on Levaquin-we will complete course of treatment with that  No indication for continued antibiotics at the present time once he finishes the current course  Plan/Recommendations: Following reviewing the CT with the patient  A CT-guided biopsy will be the most optimal intervention A bronchoscopy was also discussed-location of the mass lesion will reduce the yield from a bronchoscopy-May be able to get washings to rule out infections however, the mass likely represents a neoplastic process    We will set the patient up for CT-guided biopsy to be sent for path, cytology, fungal cultures  Sherrilyn Rist MD [facility] 03/26/2018, 10:58 AM  CC: London Pepper, MD

## 2018-03-26 NOTE — Patient Instructions (Signed)
Lung mass right lower lobe Likely postobstructive infection recently caused the fever and pleuritic pain  CT-guided biopsy will be our best option at getting a sample of this process A bronchoscopy will be lower yield but it will be an option  I will see you back in the office in about 4 weeks Following the biopsy-results take a couple of days  You will need to hold your Lovenox for at least 24 hours prior to biopsy  You can call with any concerns/questions before and following procedure

## 2018-03-27 DIAGNOSIS — R918 Other nonspecific abnormal finding of lung field: Secondary | ICD-10-CM | POA: Diagnosis not present

## 2018-03-27 DIAGNOSIS — N400 Enlarged prostate without lower urinary tract symptoms: Secondary | ICD-10-CM | POA: Diagnosis not present

## 2018-03-27 DIAGNOSIS — R091 Pleurisy: Secondary | ICD-10-CM | POA: Diagnosis not present

## 2018-03-29 ENCOUNTER — Telehealth: Payer: Self-pay | Admitting: Pulmonary Disease

## 2018-03-29 NOTE — Telephone Encounter (Signed)
We dont schedule the Ct guided biopsy's they are scheduled by the hosp they pull the order from there [place] and then they set up all of that and call the patient.  I have got the PET scan scheduled for 04/04/2018 that was the next opening

## 2018-03-29 NOTE — Telephone Encounter (Signed)
Call made to [facility] at the number listed, there is no one by the name of Craig [name] there. Staff member I spoke with looked this patient up and there is no notes there.  I have left a message with Interventional radiology at [facility]. Will await a call back. According to the message it appears they may have been trying to have [place] order the CT guided biopsy after the patients PET Scan has been done. Will route this message to [facility] pool to make them aware to schedule biopsy after PET scan.

## 2018-03-29 NOTE — Addendum Note (Signed)
Addended by: Madolyn Frieze on: 03/29/2018 09:22 AM   Modules accepted: Orders

## 2018-03-29 NOTE — Telephone Encounter (Signed)
Updated patient about scheduling a PET scan prior to a CT-guided biopsy  Order placed

## 2018-03-29 NOTE — Telephone Encounter (Signed)
Per Nira Conn this has been taken care of already schedule pt aware and Dr Ander Slade nothing further needed

## 2018-04-04 ENCOUNTER — Ambulatory Visit (HOSPITAL_COMMUNITY)
Admission: RE | Admit: 2018-04-04 | Discharge: 2018-04-04 | Disposition: A | Discharge status: Home / Self Care | Payer: Medicare Other | Source: Ambulatory Visit | Attending: Pulmonary Disease | Admitting: Pulmonary Disease

## 2018-04-04 DIAGNOSIS — R918 Other nonspecific abnormal finding of lung field: Secondary | ICD-10-CM | POA: Diagnosis not present

## 2018-04-04 DIAGNOSIS — R911 Solitary pulmonary nodule: Secondary | ICD-10-CM

## 2018-04-04 LAB — GLUCOSE, CAPILLARY: Glucose-Capillary: 91 mg/dL (ref 70–99)

## 2018-04-04 MED ORDER — FLUDEOXYGLUCOSE F - 18 (FDG) INJECTION
9.4000 | Freq: Once | INTRAVENOUS | Status: AC | PRN
  Administered 2018-04-04: 9.4 via INTRAVENOUS

## 2018-04-06 ENCOUNTER — Telehealth: Payer: Self-pay | Admitting: Pulmonary Disease

## 2018-04-06 NOTE — Telephone Encounter (Signed)
Left message for patient to call back  

## 2018-04-09 ENCOUNTER — Telehealth: Payer: Self-pay | Admitting: Pulmonary Disease

## 2018-04-09 NOTE — Telephone Encounter (Signed)
Left a message for Craig [name] to insure that Craig [name] is able to go threw with the ct guided biopsy since he has completed the pet scan.  I have left a message to call back.

## 2018-04-09 NOTE — Telephone Encounter (Signed)
Patient updated about PET scan findings  Biopsy scheduled for next week

## 2018-04-09 NOTE — Telephone Encounter (Signed)
Pt calling for results of PET scan completed on 04/04/17:  IMPRESSION: 1. Dominant right lower lobe mass and 3 left lower lobe pulmonary nodules are hypermetabolic. Although potentially inflammatory, these have not significantly changed from the CT of 03/19/2018, and are suspicious for metastatic lung cancer. Tissue sampling recommended. 2. Small mildly hypermetabolic subcarinal and right hilar lymph nodes. Indeterminate activity in right neck could reflect a small hypermetabolic level 2A node, not well visualized. 3. No evidence of metastatic disease in the abdomen or pelvis. No suspicious osseous activity. 4. Enlarging right pleural effusion and increasing dependent right lower lobe airspace disease. These partly obscure the right lower lobe mass. 5. Left renal calculi and scarring. Aortic Atherosclerosis (ICD10-I70.0).   Electronically Signed   By: Richardean Sale M.D.   On: 04/04/2018 16:50   AO, Please advise. Thanks.

## 2018-04-16 ENCOUNTER — Other Ambulatory Visit: Payer: Self-pay | Admitting: Radiology

## 2018-04-16 NOTE — Telephone Encounter (Signed)
I have attempted to reach out again left message will call back.

## 2018-04-17 ENCOUNTER — Ambulatory Visit (HOSPITAL_COMMUNITY)
Admission: RE | Admit: 2018-04-17 | Discharge: 2018-04-17 | Disposition: A | Discharge status: Home / Self Care | Payer: Medicare Other | Source: Ambulatory Visit | Attending: Pulmonary Disease | Admitting: Pulmonary Disease

## 2018-04-17 ENCOUNTER — Other Ambulatory Visit: Payer: Self-pay | Admitting: Pulmonary Disease

## 2018-04-17 ENCOUNTER — Encounter (HOSPITAL_COMMUNITY): Payer: Self-pay

## 2018-04-17 ENCOUNTER — Other Ambulatory Visit: Payer: Self-pay

## 2018-04-17 DIAGNOSIS — R911 Solitary pulmonary nodule: Secondary | ICD-10-CM | POA: Insufficient documentation

## 2018-04-17 DIAGNOSIS — J90 Pleural effusion, not elsewhere classified: Secondary | ICD-10-CM | POA: Diagnosis not present

## 2018-04-17 LAB — PROTIME-INR
INR: 1.11
Prothrombin Time: 14.2 seconds (ref 11.4–15.2)

## 2018-04-17 LAB — CBC
HCT: 38.1 % — ABNORMAL LOW (ref 39.0–52.0)
Hemoglobin: 12.2 g/dL — ABNORMAL LOW (ref 13.0–17.0)
MCH: 29 pg (ref 26.0–34.0)
MCHC: 32 g/dL (ref 30.0–36.0)
MCV: 90.5 fL (ref 80.0–100.0)
Platelets: 343 10*3/uL (ref 150–400)
RBC: 4.21 MIL/uL — ABNORMAL LOW (ref 4.22–5.81)
RDW: 12.7 % (ref 11.5–15.5)
WBC: 5.8 10*3/uL (ref 4.0–10.5)
nRBC: 0 % (ref 0.0–0.2)

## 2018-04-17 MED ORDER — SODIUM CHLORIDE 0.9 % IV SOLN: INTRAVENOUS | Status: DC

## 2018-04-17 MED ORDER — MIDAZOLAM HCL 2 MG/2ML IJ SOLN
INTRAMUSCULAR | Status: AC
  Filled 2018-04-17: qty 2

## 2018-04-17 MED ORDER — LIDOCAINE HCL (CARDIAC) PF 100 MG/5ML IV SOSY
PREFILLED_SYRINGE | INTRAVENOUS | Status: AC
  Filled 2018-04-17: qty 5

## 2018-04-17 MED ORDER — LIDOCAINE HCL 1 % IJ SOLN
INTRAMUSCULAR | Status: AC
  Filled 2018-04-17: qty 20

## 2018-04-17 MED ORDER — FENTANYL CITRATE (PF) 100 MCG/2ML IJ SOLN
INTRAMUSCULAR | Status: AC
  Filled 2018-04-17: qty 2

## 2018-04-17 NOTE — Progress Notes (Signed)
Ct Biopsy canceled per Dr. Jarvis Newcomer. Patient d/c to waiting room with steady gait. Wife with patient.

## 2018-04-17 NOTE — H&P (Signed)
Chief Complaint: Patient was seen in consultation today for lung biopsy.  Referring Physician(s): Olalere,Adewale A  Supervising Physician: Arne Cleveland  Patient Status: [facility] - Out-pt  History of Present Illness: Craig [name] is a 68 y.o. male with a past medical history significant for prostatitis, DVT (2007 and 2016) and recently discovered pulmonary nodules followed by Dr. Ander Slade. He had been experiencing weight loss, fevers, cough and some trouble breathing in December 2019 - he was treated with 3 different courses of antibiotics however he continued to feel poorly. He underwent CT chest/abdomen/pelvis with contrast on 03/19/18 for further evaluation of his symptoms which showed a 6.1 cm mass in the posterior basal segment right lower lob with surrounding airspace opacity, central heterogeneity and adjacent small right pleural effusion; multiple left lower lobe pulmonary nodules measuring up to 2.4 cm in diameter; subtle mild mediastinal adenopathy and borderline enlarged aortocaval lymph node in the upper abdomen; several small hypodense lesions in the liver; small oval-shaped 6 mm sclerotic lesion in the right fourth rib.   He then presented to [facility] ED on 03/24/18 with chest pain - CTA negative for PE, pain relieved with Norco. He was discharged to home with short rx for Norco. He was then seen by Dr. Ander Slade on 03/26/18 for initial consult and PET scan was ordered which was performed on 04/04/18. PET showed dominant right lower lobe mass and 3 left lower lobe pulmonary nodules which are hypermetabolic - suspicious for metastatic lung cancer; small mildly hypermetabolic subcarinal and right hilar lymph nodes; no evidence of metastatic disease in the abdomen or pelvis; no suspicious osseous activity; enlarging right pleural effusion and increasing dependent right lower lobe airspace disease. Request has been made to IR for pulmonary nodule biopsy for further evaluation and treatment.  Patient  reports he feels ok overall, his appetite has remained good but he continues to lose weight. He continues to have dyspnea which is unchanged from last month, mostly with exertion. He is hopeful that he can attend a concert tonight after his biopsy. He states understanding of procedure and wishes to proceed.   Past Medical History:  Diagnosis Date  . Multiple lung nodules on CT   . Prostatitis     Past Surgical History:  Procedure Laterality Date  . BACK SURGERY    . CERVICAL SPINE SURGERY    . urinary stent Left     Allergies: Gentamycin [gentamicin]; Nsaids; and Vancomycin  Medications: Prior to Admission medications   Medication Sig Start Date End Date Taking? Authorizing Provider  acetaminophen (TYLENOL) 500 MG tablet Take 500 mg by mouth every 6 (six) hours as needed for mild pain or moderate pain.   Yes [provider]  glucosamine-chondroitin 500-400 MG tablet Take 2 tablets by mouth daily.   Yes [provider]  HYDROcodone-acetaminophen (NORCO/VICODIN) 5-325 MG tablet Take 1-2 tablets by mouth every 6 (six) hours as needed for moderate pain or severe pain. 03/24/18  Yes Charlann Lange, PA-C  rivaroxaban (XARELTO) 20 MG TABS tablet Take 20 mg by mouth daily with supper.   Yes [provider]  tamsulosin (FLOMAX) 0.4 MG CAPS capsule Take 0.8 mg by mouth daily.   Yes [provider]  traMADol (ULTRAM) 50 MG tablet Take 50 mg by mouth 3 (three) times daily as needed (pain).   Yes [provider]     Family History  Problem Relation Age of Onset  . Colon cancer Mother     Social History   Socioeconomic History  .  Marital status: Married    Spouse name: Not on file  . Number of children: Not on file  . Years of education: Not on file  . Highest education level: Not on file  Occupational History  . Not on file  Social Needs  . Financial resource strain: Not on file  . Food insecurity:    Worry: Not on file    Inability: Not  on file  . Transportation needs:    Medical: Not on file    Non-medical: Not on file  Tobacco Use  . Smoking status: Never Smoker  . Smokeless tobacco: Never Used  Substance and Sexual Activity  . Alcohol use: Yes    Comment: occa  . Drug use: Never  . Sexual activity: Not on file  Lifestyle  . Physical activity:    Days per week: Not on file    Minutes per session: Not on file  . Stress: Not on file  Relationships  . Social connections:    Talks on phone: Not on file    Gets together: Not on file    Attends religious service: Not on file    Active member of club or organization: Not on file    Attends meetings of clubs or organizations: Not on file    Relationship status: Not on file  Other Topics Concern  . Not on file  Social History Narrative  . Not on file     Review of Systems: A 12 point ROS discussed and pertinent positives are indicated in the HPI above.  All other systems are negative.  Review of Systems  Constitutional: Positive for unexpected weight change (weight loss). Negative for appetite change, chills and fever.  Respiratory: Positive for shortness of breath. Negative for cough.   Cardiovascular: Negative for chest pain.  Gastrointestinal: Negative for abdominal pain, blood in stool, diarrhea, nausea and vomiting.  Genitourinary: Negative for dysuria and hematuria.  Neurological: Negative for dizziness, syncope and headaches.  Psychiatric/Behavioral: Negative for confusion.    Vital Signs: BP 128/75   Pulse 93   Temp 97.8 F (36.6 C)   Resp 18   Ht 5\' 10"  (1.778 m)   Wt 185 lb (83.9 kg)   SpO2 100%   BMI 26.54 kg/m   Physical Exam Vitals signs reviewed.  Constitutional:      General: He is not in acute distress. HENT:     Head: Normocephalic.  Cardiovascular:     Rate and Rhythm: Normal rate and regular rhythm.  Pulmonary:     Effort: Pulmonary effort is normal.     Breath sounds: Normal breath sounds.  Abdominal:     General:  Bowel sounds are normal. There is no distension.     Palpations: Abdomen is soft.  Skin:    General: Skin is warm and dry.  Neurological:     Mental Status: He is alert and oriented to person, place, and time.  Psychiatric:        Mood and Affect: Mood normal.        Behavior: Behavior normal.        Thought Content: Thought content normal.        Judgment: Judgment normal.      MD Evaluation Airway: WNL Heart: WNL Abdomen: WNL Chest/ Lungs: WNL ASA  Classification: 3 Mallampati/Airway Score: One   Imaging: Dg Chest 2 View  Addendum Date: 03/23/2018   ADDENDUM REPORT: 03/23/2018 23:47 ADDENDUM: The chest x-ray from today is compare with prior CT chest  from March 19, 2018. The consolidation of right lung base with pleural effusion is not significantly changed compared to prior CT noted masslike area. There is at masslike lesion in the medial left lung base which is also unchanged compared to prior CT scan. Electronically Signed   By: Abelardo Diesel M.D.   On: 03/23/2018 23:47   Result Date: 03/23/2018 CLINICAL DATA:  Hervey Ard shooting right chest pain since yesterday EXAM: CHEST - 2 VIEW COMPARISON:  March 10, 2018 FINDINGS: The heart size and mediastinal contours are within normal limits. Small right pleural effusion is identified. Patchy consolidation of right lung base is noted. The left lung is clear. The visualized skeletal structures are unremarkable. IMPRESSION: Right lung base pneumonia with small right pleural effusion. Electronically Signed: By: Abelardo Diesel M.D. On: 03/23/2018 21:05   Ct Chest W Contrast  Result Date: 03/19/2018 CLINICAL DATA:  Low-grade fever for 3 weeks. Weight loss. Headache. EXAM: CT CHEST, ABDOMEN, AND PELVIS WITH CONTRAST TECHNIQUE: Multidetector CT imaging of the chest, abdomen and pelvis was performed following the standard protocol during bolus administration of intravenous contrast. CONTRAST:  16mL ISOVUE-300 IOPAMIDOL (ISOVUE-300)  INJECTION 61% COMPARISON:  None. FINDINGS: CT CHEST FINDINGS Cardiovascular: Linear calcification along the left anterior descending coronary artery compatible with atherosclerosis. Trace anterior pericardial effusion. Mediastinum/Nodes: Right eccentric subcarinal node 1.2 cm in short axis on image 31/2. Right lower paratracheal node 1.0 cm in short axis, image 25/2. The more cephalad right paratracheal node measures 0.7 cm in short axis on image 15/2. Right supraclavicular node 0.6 cm in short axis, image 6/2. Lungs/Pleura: 5.7 by 5.0 by 6.1 cm posterior basal segment right lower lobe mass with central heterogeneity, image 45/2. Surrounding airspace opacity noted. Small adjacent right pleural effusion with a subtle suggestion of enhancement along the parietal pleural margin. 1.5 by 1.2 cm left lower lobe pulmonary nodule, image 93/4. 2.4 by 2.3 cm left lower lobe pulmonary nodule, image 103/4. 2.3 by 2.3 cm left lower lobe pulmonary nodule, image 117/4. Scattered bandlike atelectasis in both lower lobes, and to a lesser extent in the lower portions of the lingula and right middle lobe. No definite signs of chest wall invasion. Musculoskeletal: Oval-shaped 0.8 by 0.4 cm sclerotic lesion posterolaterally in the right fourth rib, probably benign/incidental on image 50/4, but merits surveillance. CT ABDOMEN PELVIS FINDINGS Hepatobiliary: 7 mm hypodense lesion in segment 2 of the liver on image 50/2. 4 mm hypodense lesion in segment 2 of the liver on image 51/2. Both of these lesions are technically too small to characterize. A similar 0.6 cm lesion is present in segment of the liver on image 73/2. The gallbladder appears normal. Pancreas: Unremarkable Spleen: Unremarkable Adrenals/Urinary Tract: Adrenal glands normal. 1.4 cm right kidney lower pole fluid density cyst. Extensive scattered scarring in the left kidney causing a lobular appearance. Several left renal calculi including a 1.1 cm calculus on image 109/5; a  0.4 cm calculus on image 105/5; a 1.1 cm calculus on image 113/5; and a couple of additional tiny calculi. Stomach/Bowel: Slight lobularity along the lesser curvature margin of the proximal stomach on images 50-52 of series 2 could represent an unusual fold in the stomach surface, or a small mass. Sigmoid colon diverticulosis. Vascular/Lymphatic: Aortoiliac atherosclerotic vascular disease. Borderline enlarged aortocaval node measures 1.0 cm in short axis on image 69/2. Reproductive: Moderate prostatomegaly. Punctate calcifications in the prostate gland. Other: No supplemental non-categorized findings. Musculoskeletal: Interbody fusion at L4-5. Degenerative disc disease at the other lumbar levels along with  lumbar spondylosis. IMPRESSION: 1. 6.1 cm mass in the posterior basal segment right lower lobe with surrounding airspace opacity, central heterogeneity, and adjacent small right pleural effusion. Multiple left lower lobe pulmonary nodules measuring up to 2.4 cm in diameter. Subtle mild mediastinal adenopathy and borderline enlarged aortocaval lymph node in the upper abdomen. The appearance is most concerning for metastatic lung cancer. Non malignant possibilities such as tuberculosis or fungal pneumonia, or an unusual presentation of cavitary bacterial pneumonia, are considered possible but less likely. Lobularity along the lesser curvature of the stomach could be from an unusual fold in the gastric wall, or from a mass. This could be assessed by endoscopy or PET-CT. 2. Several small hypodense lesions in the liver are statistically likely to be benign lesions such as cysts, but technically too small to characterize. 3. Small oval-shaped 6 mm sclerotic lesion in the right fourth rib is probably benign but merits surveillance. 4. Other imaging findings of potential clinical significance: Left anterior descending coronary atherosclerosis. Trace anterior pericardial effusion. Nonobstructive left nephrolithiasis.  Chronic left renal scarring. Aortoiliac atherosclerotic vascular disease. Moderate prostatomegaly. Degenerative disc disease and lumbar spondylosis. Electronically Signed   By: Van Clines M.D.   On: 03/19/2018 10:36   Ct Angio Chest Pe W And/or Wo Contrast  Result Date: 03/24/2018 CLINICAL DATA:  Chest pain with shortness of breath. EXAM: CT ANGIOGRAPHY CHEST WITH CONTRAST TECHNIQUE: Multidetector CT imaging of the chest was performed using the standard protocol during bolus administration of intravenous contrast. Multiplanar CT image reconstructions and MIPs were obtained to evaluate the vascular anatomy. CONTRAST:  166mL ISOVUE-370 IOPAMIDOL (ISOVUE-370) INJECTION 76% COMPARISON:  Chest CT 03/19/2018 FINDINGS: Cardiovascular: --Pulmonary arteries: Contrast injection is sufficient to demonstrate satisfactory opacification of the pulmonary arteries to the segmental level. There is no pulmonary embolus. The main pulmonary artery is within normal limits for size. --Aorta: Limited opacification of the aorta due to bolus timing optimization for the pulmonary arteries. Conventional 3 vessel aortic branching pattern. The aortic course and caliber are normal. There is no aortic atherosclerosis. --Heart: Normal size. No pericardial effusion. Mediastinum/Nodes: No mediastinal, hilar or axillary lymphadenopathy. The visualized thyroid and thoracic esophageal course are unremarkable. Lungs/Pleura: There is a rounded, masslike opacity in the posterior right lower lobe that measures 5.7 x 4.3 cm. There are nodular opacities in the posterior left lung base that measure up to 2.2 cm. There is a small right pleural effusion. Upper Abdomen: Contrast bolus timing is not optimized for evaluation of the abdominal organs. Within this limitation, the visualized organs of the upper abdomen are normal. Musculoskeletal: No chest wall abnormality. No acute or significant osseous findings. Review of the MIP images confirms the  above findings. IMPRESSION: 1. No pulmonary embolus. 2. Masslike opacity in the posterior right lower lobe and multiple left lower lobe nodular opacities may be infectious or neoplastic. Apparent development since 03/10/2018 argues for infectious or inflammatory process. At a minimum, short-term follow-up chest CT is recommended in 3-4 weeks. 3. Small right pleural effusion. Electronically Signed   By: Ulyses Jarred M.D.   On: 03/24/2018 02:26   Ct Abdomen Pelvis W Contrast  Result Date: 03/19/2018 CLINICAL DATA:  Low-grade fever for 3 weeks. Weight loss. Headache. EXAM: CT CHEST, ABDOMEN, AND PELVIS WITH CONTRAST TECHNIQUE: Multidetector CT imaging of the chest, abdomen and pelvis was performed following the standard protocol during bolus administration of intravenous contrast. CONTRAST:  129mL ISOVUE-300 IOPAMIDOL (ISOVUE-300) INJECTION 61% COMPARISON:  None. FINDINGS: CT CHEST FINDINGS Cardiovascular: Linear calcification along  the left anterior descending coronary artery compatible with atherosclerosis. Trace anterior pericardial effusion. Mediastinum/Nodes: Right eccentric subcarinal node 1.2 cm in short axis on image 31/2. Right lower paratracheal node 1.0 cm in short axis, image 25/2. The more cephalad right paratracheal node measures 0.7 cm in short axis on image 15/2. Right supraclavicular node 0.6 cm in short axis, image 6/2. Lungs/Pleura: 5.7 by 5.0 by 6.1 cm posterior basal segment right lower lobe mass with central heterogeneity, image 45/2. Surrounding airspace opacity noted. Small adjacent right pleural effusion with a subtle suggestion of enhancement along the parietal pleural margin. 1.5 by 1.2 cm left lower lobe pulmonary nodule, image 93/4. 2.4 by 2.3 cm left lower lobe pulmonary nodule, image 103/4. 2.3 by 2.3 cm left lower lobe pulmonary nodule, image 117/4. Scattered bandlike atelectasis in both lower lobes, and to a lesser extent in the lower portions of the lingula and right middle lobe.  No definite signs of chest wall invasion. Musculoskeletal: Oval-shaped 0.8 by 0.4 cm sclerotic lesion posterolaterally in the right fourth rib, probably benign/incidental on image 50/4, but merits surveillance. CT ABDOMEN PELVIS FINDINGS Hepatobiliary: 7 mm hypodense lesion in segment 2 of the liver on image 50/2. 4 mm hypodense lesion in segment 2 of the liver on image 51/2. Both of these lesions are technically too small to characterize. A similar 0.6 cm lesion is present in segment of the liver on image 73/2. The gallbladder appears normal. Pancreas: Unremarkable Spleen: Unremarkable Adrenals/Urinary Tract: Adrenal glands normal. 1.4 cm right kidney lower pole fluid density cyst. Extensive scattered scarring in the left kidney causing a lobular appearance. Several left renal calculi including a 1.1 cm calculus on image 109/5; a 0.4 cm calculus on image 105/5; a 1.1 cm calculus on image 113/5; and a couple of additional tiny calculi. Stomach/Bowel: Slight lobularity along the lesser curvature margin of the proximal stomach on images 50-52 of series 2 could represent an unusual fold in the stomach surface, or a small mass. Sigmoid colon diverticulosis. Vascular/Lymphatic: Aortoiliac atherosclerotic vascular disease. Borderline enlarged aortocaval node measures 1.0 cm in short axis on image 69/2. Reproductive: Moderate prostatomegaly. Punctate calcifications in the prostate gland. Other: No supplemental non-categorized findings. Musculoskeletal: Interbody fusion at L4-5. Degenerative disc disease at the other lumbar levels along with lumbar spondylosis. IMPRESSION: 1. 6.1 cm mass in the posterior basal segment right lower lobe with surrounding airspace opacity, central heterogeneity, and adjacent small right pleural effusion. Multiple left lower lobe pulmonary nodules measuring up to 2.4 cm in diameter. Subtle mild mediastinal adenopathy and borderline enlarged aortocaval lymph node in the upper abdomen. The  appearance is most concerning for metastatic lung cancer. Non malignant possibilities such as tuberculosis or fungal pneumonia, or an unusual presentation of cavitary bacterial pneumonia, are considered possible but less likely. Lobularity along the lesser curvature of the stomach could be from an unusual fold in the gastric wall, or from a mass. This could be assessed by endoscopy or PET-CT. 2. Several small hypodense lesions in the liver are statistically likely to be benign lesions such as cysts, but technically too small to characterize. 3. Small oval-shaped 6 mm sclerotic lesion in the right fourth rib is probably benign but merits surveillance. 4. Other imaging findings of potential clinical significance: Left anterior descending coronary atherosclerosis. Trace anterior pericardial effusion. Nonobstructive left nephrolithiasis. Chronic left renal scarring. Aortoiliac atherosclerotic vascular disease. Moderate prostatomegaly. Degenerative disc disease and lumbar spondylosis. Electronically Signed   By: Van Clines M.D.   On: 03/19/2018 10:36  Nm Pet Image Initial (pi) Skull Base To Thigh  Result Date: 04/04/2018 CLINICAL DATA:  Initial treatment strategy for bibasilar pulmonary nodules on chest CT. EXAM: NUCLEAR MEDICINE PET SKULL BASE TO THIGH TECHNIQUE: 9.4 mCi F-18 FDG was injected intravenously. Full-ring PET imaging was performed from the skull base to thigh after the radiotracer. CT data was obtained and used for attenuation correction and anatomic localization. Fasting blood glucose: 91 mg/dl COMPARISON:  Chest radiographs 03/10/2018, CTs of the chest, abdomen and pelvis 03/19/2018 and chest CT 03/24/2018. FINDINGS: Mediastinal blood pool activity: SUV max 2.6 NECK: Lateral to the right hyoid bone, there is a small focus of hypermetabolic activity (SUV max 4.2) along the right carotid sheath. No well-defined enlarged lymph node is seen in this area, although there is some artifact related to  adjacent cervical spine fusion plate. No other hypermetabolic cervical lymph nodes identified.There are no lesions of the pharyngeal mucosal space. Incidental CT findings: none CHEST: 11 mm subcarinal node is mildly hypermetabolic with an SUV max of 4.7. There is a small right hilar node which demonstrates mild hypermetabolic activity (SUV max 3.6). There are hypermetabolic nodules posteriorly at both lung bases. The dominant mass posteriorly in the right lower lobe is partly obscured by right lower lobe atelectasis and a right pleural effusion, although measures approximately 6.0 x 4.1 cm on image 106/4. This is intensely hypermetabolic with an SUV max of 12.7. There are 3 left lower lobe nodules which are hypermetabolic. The largest measure 2.4 x 1.8 cm on image 49/8 (SUV max 9.8) and 2.2 x 1.8 cm on image 53/8 (SUV max 7.8). These nodules are similar in size to the baseline study from 04/19/2017, suspicious for malignancy. Incidental CT findings: Moderate size dependent right pleural effusion has increased, with increasing right lower lobe airspace disease, partly obscuring the dominant right lower lobe mass. No focal airspace disease on the left. Atherosclerosis of the aorta, great vessels and coronary arteries. ABDOMEN/PELVIS: There is no hypermetabolic activity within the liver, adrenal glands, spleen or pancreas. There is no hypermetabolic nodal activity. No abnormal activity identified in the stomach. Incidental CT findings: Stable small hepatic lesions, left renal calculi and left renal cortical scarring. Diverticular changes throughout the distal colon and mild aortic and branch vessel atherosclerosis. SKELETON: There is no hypermetabolic activity to suggest osseous metastatic disease. Incidental CT findings: none IMPRESSION: 1. Dominant right lower lobe mass and 3 left lower lobe pulmonary nodules are hypermetabolic. Although potentially inflammatory, these have not significantly changed from the CT of  03/19/2018, and are suspicious for metastatic lung cancer. Tissue sampling recommended. 2. Small mildly hypermetabolic subcarinal and right hilar lymph nodes. Indeterminate activity in right neck could reflect a small hypermetabolic level 2A node, not well visualized. 3. No evidence of metastatic disease in the abdomen or pelvis. No suspicious osseous activity. 4. Enlarging right pleural effusion and increasing dependent right lower lobe airspace disease. These partly obscure the right lower lobe mass. 5. Left renal calculi and scarring. Aortic Atherosclerosis (ICD10-I70.0). Electronically Signed   By: Richardean Sale M.D.   On: 04/04/2018 16:50    Labs:  CBC: Recent Labs    03/23/18 2043 04/17/18 0923  WBC 10.2 5.8  HGB 12.8* 12.2*  HCT 39.9 38.1*  PLT 405* 343    COAGS: Recent Labs    04/17/18 0923  INR 1.11    BMP: Recent Labs    03/23/18 2043  NA 133*  K 4.2  CL 98  CO2 26  GLUCOSE 120*  BUN 20  CALCIUM 9.3  CREATININE 1.06  GFRNONAA >60  GFRAA >60    LIVER FUNCTION TESTS: No results for input(s): BILITOT, AST, ALT, ALKPHOS, PROT, ALBUMIN in the last 8760 hours.  TUMOR MARKERS: No results for input(s): AFPTM, CEA, CA199, CHROMGRNA in the last 8760 hours.  Assessment and Plan:  Patient with recently discovered bilateral pulmonary nodules suspicious for metastatic disease followed by Dr. Ander Slade who has requested a biopsy of this hypermetabolic nodules to determine further management. Patient has been approved for right lower lobe lung nodule biopsy today in IR.  Patient has been NPO since 9 pm yesterday, he does take Xarelto with last dose 1/19. Afebrile, WBC 5.8, hgb 12.2, plt 343, INR 1.11.  Risks and benefits discussed with the patient including, but not limited to bleeding, hemoptysis, respiratory failure requiring intubation, infection, pneumothorax requiring chest tube placement, stroke from air embolism or even death.  All of the patient's questions were  answered, patient is agreeable to proceed.  Consent signed and in chart.  Thank you for this interesting consult.  I greatly enjoyed meeting Lavin Petteway and look forward to participating in their care.  A copy of this report was sent to the requesting provider on this date.  Electronically Signed: Joaquim Nam, PA-C 04/17/2018, 10:15 AM   I spent a total of  30 Minutes in face to face in clinical consultation, greater than 50% of which was counseling/coordinating care for lung nodule biopsy.

## 2018-04-18 ENCOUNTER — Telehealth: Payer: Self-pay | Admitting: Pulmonary Disease

## 2018-04-18 ENCOUNTER — Other Ambulatory Visit: Payer: Self-pay | Admitting: Pulmonary Disease

## 2018-04-18 DIAGNOSIS — J181 Lobar pneumonia, unspecified organism: Secondary | ICD-10-CM

## 2018-04-18 DIAGNOSIS — R9389 Abnormal findings on diagnostic imaging of other specified body structures: Secondary | ICD-10-CM

## 2018-04-18 NOTE — Telephone Encounter (Signed)
Sent a result message to you on Craig [name]  You can schedule him for an office visit soon if he is still concerned

## 2018-04-18 NOTE — Progress Notes (Signed)
I have called and spoke with the patient I have explained the situation and he now understands nothing further is needed.

## 2018-04-18 NOTE — Progress Notes (Unsigned)
IR

## 2018-04-18 NOTE — Telephone Encounter (Signed)
I have called and spoke with the patient I have explained the situation and he now understands nothing further is needed.

## 2018-04-18 NOTE — Telephone Encounter (Signed)
IR Thoracentesis to check fluid which was increasing on PET Scan, will check for infection and cancer cells

## 2018-04-23 ENCOUNTER — Other Ambulatory Visit: Payer: Self-pay | Admitting: Pulmonary Disease

## 2018-04-23 ENCOUNTER — Ambulatory Visit (HOSPITAL_COMMUNITY)
Admission: RE | Admit: 2018-04-23 | Discharge: 2018-04-23 | Disposition: A | Discharge status: Home / Self Care | Payer: Medicare Other | Source: Ambulatory Visit | Attending: Pulmonary Disease | Admitting: Pulmonary Disease

## 2018-04-23 ENCOUNTER — Encounter (HOSPITAL_COMMUNITY): Payer: Self-pay | Admitting: Radiology

## 2018-04-23 DIAGNOSIS — R9389 Abnormal findings on diagnostic imaging of other specified body structures: Secondary | ICD-10-CM

## 2018-04-23 DIAGNOSIS — J90 Pleural effusion, not elsewhere classified: Secondary | ICD-10-CM | POA: Diagnosis not present

## 2018-04-23 DIAGNOSIS — J181 Lobar pneumonia, unspecified organism: Secondary | ICD-10-CM

## 2018-04-23 DIAGNOSIS — R091 Pleurisy: Secondary | ICD-10-CM | POA: Diagnosis not present

## 2018-04-23 DIAGNOSIS — J984 Other disorders of lung: Secondary | ICD-10-CM | POA: Diagnosis not present

## 2018-04-23 HISTORY — PX: IR THORACENTESIS ASP PLEURAL SPACE W/IMG GUIDE: IMG5380

## 2018-04-23 LAB — BODY FLUID CELL COUNT WITH DIFFERENTIAL
Eos, Fluid: 13 %
Lymphs, Fluid: 30 %
Monocyte-Macrophage-Serous Fluid: 25 % — ABNORMAL LOW (ref 50–90)
Neutrophil Count, Fluid: 32 % — ABNORMAL HIGH (ref 0–25)
Total Nucleated Cell Count, Fluid: 3620 cu mm — ABNORMAL HIGH (ref 0–1000)

## 2018-04-23 LAB — GLUCOSE, PLEURAL OR PERITONEAL FLUID: Glucose, Fluid: 93 mg/dL

## 2018-04-23 LAB — GRAM STAIN

## 2018-04-23 LAB — PROTEIN, PLEURAL OR PERITONEAL FLUID: Total protein, fluid: 4.7 g/dL

## 2018-04-23 LAB — LACTATE DEHYDROGENASE, PLEURAL OR PERITONEAL FLUID: LD FL: 213 U/L — AB (ref 3–23)

## 2018-04-23 MED ORDER — LIDOCAINE HCL 1 % IJ SOLN
INTRAMUSCULAR | Status: AC
  Filled 2018-04-23: qty 20

## 2018-04-23 MED ORDER — LIDOCAINE HCL (PF) 1 % IJ SOLN
INTRAMUSCULAR | Status: DC | PRN
  Administered 2018-04-23: 10 mL

## 2018-04-23 NOTE — Procedures (Signed)
PROCEDURE SUMMARY:  Successful US guided right thoracentesis. Yielded 420 mL of slightly hazy amber fluid. Pt tolerated procedure well. No immediate complications.  Specimen was sent for labs. CXR ordered.  EBL < 5 mL  Ascencion Dike PA-C 04/23/2018 10:28 AM

## 2018-04-24 LAB — PH, BODY FLUID: pH, Body Fluid: 7.5

## 2018-04-24 LAB — ACID FAST SMEAR (AFB)

## 2018-04-24 LAB — ACID FAST SMEAR (AFB, MYCOBACTERIA): Acid Fast Smear: NEGATIVE

## 2018-04-25 ENCOUNTER — Ambulatory Visit (INDEPENDENT_AMBULATORY_CARE_PROVIDER_SITE_OTHER): Payer: Medicare Other | Admitting: Pulmonary Disease

## 2018-04-25 ENCOUNTER — Encounter: Payer: Self-pay | Admitting: Pulmonary Disease

## 2018-04-25 VITALS — BP 150/78 | HR 94 | Ht 70.0 in | Wt 185.0 lb

## 2018-04-25 DIAGNOSIS — R0602 Shortness of breath: Secondary | ICD-10-CM

## 2018-04-25 DIAGNOSIS — R911 Solitary pulmonary nodule: Secondary | ICD-10-CM

## 2018-04-25 LAB — CHOLESTEROL, BODY FLUID: Cholesterol, Fluid: 91 mg/dL

## 2018-04-25 NOTE — Patient Instructions (Signed)
I will see you back in the office in about 4 weeks  Repeat CT 6 weeks from your last one  Pulmonary function study  We will inform you of the results of the fluids become finalized  Call with any significant concerns

## 2018-04-25 NOTE — Progress Notes (Signed)
Craig [name]    [id number]    03-29-1950  Primary Care Physician:Morrow, Marjory Lies, MD  Referring Physician: London Pepper, MD [address], [place] [address]  Chief complaint:  Abnormal CT scan of the chest showing a right right lower lobe mass lesion, pleural-based Small nodules on the left Status post thoracentesis  HPI:  Thoracentesis yielded an exudative fluid, cytology pending Still limited with activities but improving No longer having fevers, still has an occasional cough He was scheduled for CT-guided biopsy but the CT did reveal improvement in findings-no biopsies taken He was subsequently scheduled for ultrasound-guided thoracentesis  Usually healthy Started with febrile illness about December 3 Treated with a course of antibiotics for possible UTI With nonresolution of symptoms and development of respiratory symptoms He was started on another course of antibiotics In total is used 3 courses of antibiotics He is no longer having fevers but still feels a little bit under the weather  He did start having some pleuritic chest pains Has lost about 13 pounds since the onset of symptoms-despite an adequate appetite  Had a CT scan of the chest showing multiple nodules, a mass at the right base noted on CT with associated small effusion  Occupation: Was in the [employer]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 acetaminophen (TYLENOL) 500 MG tablet Take 500 mg by mouth every 6 (six) hours as needed for mild pain or moderate pain.  Marland Kitchen glucosamine-chondroitin 500-400 MG tablet Take 2 tablets by mouth daily.  Marland Kitchen HYDROcodone-acetaminophen (NORCO/VICODIN) 5-325 MG tablet Take 1-2 tablets by mouth every 6 (six) hours as needed for moderate pain or severe pain.  . rivaroxaban (XARELTO) 20 MG TABS tablet Take 20 mg by mouth daily with supper.  . tamsulosin (FLOMAX)  0.4 MG CAPS capsule Take 0.8 mg by mouth daily.  . traMADol (ULTRAM) 50 MG tablet Take 50 mg by mouth 3 (three) times daily as needed (pain).   No facility-administered encounter medications on file as of 04/25/2018.     Allergies as of 04/25/2018 - Review Complete 04/25/2018  Allergen Reaction Noted  . Gentamycin [gentamicin] Other (See Comments) 03/23/2018  . Nsaids  03/23/2018  . Vancomycin Rash 03/23/2018    Past Medical History:  Diagnosis Date  . Multiple lung nodules on CT   . Prostatitis     Past Surgical History:  Procedure Laterality Date  . BACK SURGERY    . CERVICAL SPINE SURGERY    . IR THORACENTESIS ASP PLEURAL SPACE W/IMG GUIDE  04/23/2018  . urinary stent Left     Family History  Problem Relation Age of Onset  . Colon cancer Mother     Social History   Socioeconomic History  . Marital status: Married    Spouse name: Not on file  . Number of children: Not on file  . Years of education: Not on file  . Highest education level: Not on file  Occupational History  . Not on file  Social Needs  . Financial resource strain: Not on file  . Food insecurity:    Worry: Not on file    Inability: Not on file  . Transportation needs:    Medical: Not on file    Non-medical: Not on file  Tobacco Use  . Smoking status: Never Smoker  . Smokeless tobacco: Never Used  Substance and Sexual Activity  . Alcohol  use: Yes    Comment: occa  . Drug use: Never  . Sexual activity: Not on file  Lifestyle  . Physical activity:    Days per week: Not on file    Minutes per session: Not on file  . Stress: Not on file  Relationships  . Social connections:    Talks on phone: Not on file    Gets together: Not on file    Attends religious service: Not on file    Active member of club or organization: Not on file    Attends meetings of clubs or organizations: Not on file    Relationship status: Not on file  . Intimate partner violence:    Fear of current or ex partner:  Not on file    Emotionally abused: Not on file    Physically abused: Not on file    Forced sexual activity: Not on file  Other Topics Concern  . Not on file  Social History Narrative  . Not on file    Review of Systems  Constitutional: Positive for fatigue.  HENT: Negative.   Eyes: Negative.   Respiratory: Positive for cough. Negative for choking, chest tightness, shortness of breath, wheezing and stridor.   Cardiovascular: Negative.   Gastrointestinal: Negative.   Endocrine: Negative.   Musculoskeletal: Negative.     Vitals:   04/25/18 1120  BP: (!) 150/78  Pulse: 94  SpO2: 96%     Physical Exam  Constitutional: He appears well-developed and well-nourished.  HENT:  Head: Normocephalic and atraumatic.  Eyes: Pupils are equal, round, and reactive to light. Conjunctivae and EOM are normal. Right eye exhibits no discharge. Left eye exhibits no discharge.  Neck: Normal range of motion. Neck supple. No tracheal deviation present. No thyromegaly present.  Cardiovascular: Normal rate and regular rhythm. Exam reveals no friction rub.  No murmur heard. Pulmonary/Chest: Effort normal and breath sounds normal. No respiratory distress. He has no wheezes. He has no rales. He exhibits no tenderness.  Abdominal: Soft. Bowel sounds are normal. He exhibits no distension. There is no abdominal tenderness. There is no rebound.  Neurological: He is alert.  Psychiatric: He has a normal mood and affect.   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 04/25/2018, 12:03 PM  CC: London Pepper, MD

## 2018-04-25 NOTE — Telephone Encounter (Signed)
Nothing further needed 

## 2018-04-28 LAB — CULTURE, BODY FLUID W GRAM STAIN -BOTTLE: Culture: NO GROWTH

## 2018-05-16 DIAGNOSIS — R351 Nocturia: Secondary | ICD-10-CM | POA: Diagnosis not present

## 2018-05-16 DIAGNOSIS — R350 Frequency of micturition: Secondary | ICD-10-CM | POA: Diagnosis not present

## 2018-05-21 DIAGNOSIS — M5441 Lumbago with sciatica, right side: Secondary | ICD-10-CM | POA: Diagnosis not present

## 2018-05-21 DIAGNOSIS — G8929 Other chronic pain: Secondary | ICD-10-CM | POA: Diagnosis not present

## 2018-05-28 ENCOUNTER — Other Ambulatory Visit: Payer: Self-pay | Admitting: Physician Assistant

## 2018-05-28 DIAGNOSIS — G8929 Other chronic pain: Secondary | ICD-10-CM

## 2018-05-28 DIAGNOSIS — M5441 Lumbago with sciatica, right side: Principal | ICD-10-CM

## 2018-05-28 LAB — FUNGUS CULTURE WITH STAIN

## 2018-05-28 LAB — FUNGUS CULTURE RESULT

## 2018-05-28 LAB — FUNGAL ORGANISM REFLEX

## 2018-06-03 ENCOUNTER — Ambulatory Visit
Admission: RE | Admit: 2018-06-03 | Discharge: 2018-06-03 | Disposition: A | Discharge status: Home / Self Care | Payer: Medicare Other | Source: Ambulatory Visit | Attending: Physician Assistant | Admitting: Physician Assistant

## 2018-06-03 DIAGNOSIS — G8929 Other chronic pain: Secondary | ICD-10-CM

## 2018-06-03 DIAGNOSIS — M5127 Other intervertebral disc displacement, lumbosacral region: Secondary | ICD-10-CM | POA: Diagnosis not present

## 2018-06-03 DIAGNOSIS — M5441 Lumbago with sciatica, right side: Principal | ICD-10-CM

## 2018-06-03 DIAGNOSIS — M4807 Spinal stenosis, lumbosacral region: Secondary | ICD-10-CM | POA: Diagnosis not present

## 2018-06-04 ENCOUNTER — Ambulatory Visit (INDEPENDENT_AMBULATORY_CARE_PROVIDER_SITE_OTHER)
Admission: RE | Admit: 2018-06-04 | Discharge: 2018-06-04 | Disposition: A | Discharge status: Home / Self Care | Payer: Medicare Other | Source: Ambulatory Visit | Attending: Pulmonary Disease | Admitting: Pulmonary Disease

## 2018-06-04 DIAGNOSIS — R918 Other nonspecific abnormal finding of lung field: Secondary | ICD-10-CM | POA: Diagnosis not present

## 2018-06-04 DIAGNOSIS — R911 Solitary pulmonary nodule: Secondary | ICD-10-CM | POA: Diagnosis not present

## 2018-06-05 ENCOUNTER — Encounter: Payer: Self-pay | Admitting: Pulmonary Disease

## 2018-06-05 ENCOUNTER — Ambulatory Visit (INDEPENDENT_AMBULATORY_CARE_PROVIDER_SITE_OTHER): Payer: Medicare Other | Admitting: Pulmonary Disease

## 2018-06-05 VITALS — BP 130/80 | HR 82 | Ht 70.5 in | Wt 194.0 lb

## 2018-06-05 DIAGNOSIS — R911 Solitary pulmonary nodule: Secondary | ICD-10-CM | POA: Diagnosis not present

## 2018-06-05 DIAGNOSIS — R0602 Shortness of breath: Secondary | ICD-10-CM

## 2018-06-05 LAB — PULMONARY FUNCTION TEST
DL/VA % pred: 104 %
DL/VA: 4.26 ml/min/mmHg/L
DLCO cor % pred: 101 %
DLCO cor: 27 ml/min/mmHg
DLCO unc % pred: 93 %
DLCO unc: 24.98 ml/min/mmHg
FEF 25-75 PRE: 3.56 L/s
FEF 25-75 Post: 4.16 L/sec
FEF2575-%Change-Post: 16 %
FEF2575-%Pred-Post: 158 %
FEF2575-%Pred-Pre: 135 %
FEV1-%Change-Post: 2 %
FEV1-%Pred-Post: 108 %
FEV1-%Pred-Pre: 105 %
FEV1-Post: 3.67 L
FEV1-Pre: 3.58 L
FEV1FVC-%Change-Post: 2 %
FEV1FVC-%Pred-Pre: 108 %
FEV6-%Change-Post: 0 %
FEV6-%Pred-Post: 102 %
FEV6-%Pred-Pre: 101 %
FEV6-POST: 4.43 L
FEV6-Pre: 4.4 L
FEV6FVC-%Change-Post: 0 %
FEV6FVC-%Pred-Post: 105 %
FEV6FVC-%Pred-Pre: 104 %
FVC-%Change-Post: 0 %
FVC-%Pred-Post: 97 %
FVC-%Pred-Pre: 96 %
FVC-Post: 4.45 L
FVC-Pre: 4.44 L
PRE FEV1/FVC RATIO: 81 %
Post FEV1/FVC ratio: 83 %
Post FEV6/FVC ratio: 100 %
Pre FEV6/FVC Ratio: 99 %
RV % pred: 68 %
RV: 1.67 L
TLC % PRED: 84 %
TLC: 6.04 L

## 2018-06-05 NOTE — Progress Notes (Signed)
Full PFT performed today. °

## 2018-06-05 NOTE — Progress Notes (Signed)
Craig [name]    [id number]    [birth year]  Primary Care Physician:Morrow, Marjory Lies, MD  Referring Physician: London Pepper, MD [address], [place] [address]  Chief complaint:  Abnormal CT scan of the chest showing a right right lower lobe mass lesion, pleural-based Small nodules on the left Status post thoracentesis Continues to improve  HPI: Has started to exercise regularly No significant cough No significant shortness of breath with activity He continues to feel better overall  Thoracentesis yielded an exudative fluid, cytology pending Still limited with activities but improving No longer having fevers, still has an occasional cough He was scheduled for CT-guided biopsy but the CT did reveal improvement in findings-no biopsies taken He was subsequently scheduled for ultrasound-guided thoracentesis  Usually healthy Started with febrile illness about December 3 Treated with a course of antibiotics for possible UTI With nonresolution of symptoms and development of respiratory symptoms He was started on another course of antibiotics In total is used 3 courses of antibiotics He is no longer having fevers but still feels a little bit under the weather  He did start having some pleuritic chest pains Has lost about 13 pounds since the onset of symptoms-despite an adequate appetite  Had a CT scan of the chest showing multiple nodules, a mass at the right base noted on CT with associated small effusion  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 acetaminophen (TYLENOL) 500 MG tablet Take 500 mg by mouth every 6 (six) hours as needed for mild pain or moderate pain.  Marland Kitchen glucosamine-chondroitin 500-400 MG tablet Take 2 tablets by mouth daily.  Marland Kitchen HYDROcodone-acetaminophen (NORCO/VICODIN) 5-325 MG tablet Take 1-2 tablets by mouth every 6  (six) hours as needed for moderate pain or severe pain.  . rivaroxaban (XARELTO) 20 MG TABS tablet Take 20 mg by mouth daily with supper.  . tamsulosin (FLOMAX) 0.4 MG CAPS capsule Take 0.8 mg by mouth daily.  . traMADol (ULTRAM) 50 MG tablet Take 50 mg by mouth 3 (three) times daily as needed (pain).   No facility-administered encounter medications on file as of 06/05/2018.     Allergies as of 06/05/2018 - Review Complete 06/05/2018  Allergen Reaction Noted  . Gentamycin [gentamicin] Other (See Comments) 03/23/2018  . Nsaids  03/23/2018  . Vancomycin Rash 03/23/2018    Past Medical History:  Diagnosis Date  . Multiple lung nodules on CT   . Prostatitis     Past Surgical History:  Procedure Laterality Date  . BACK SURGERY    . CERVICAL SPINE SURGERY    . IR THORACENTESIS ASP PLEURAL SPACE W/IMG GUIDE  04/23/2018  . urinary stent Left     Family History  Problem Relation Age of Onset  . Colon cancer Mother     Social History   Socioeconomic History  . Marital status: Married    Spouse name: Not on file  . Number of children: Not on file  . Years of education: Not on file  . Highest education level: Not on file  Occupational History  . Not on file  Social Needs  . Financial resource strain: Not on file  . Food insecurity:    Worry: Not on file    Inability: Not on file  . Transportation needs:    Medical: Not on file    Non-medical: Not on  file  Tobacco Use  . Smoking status: Never Smoker  . Smokeless tobacco: Never Used  Substance and Sexual Activity  . Alcohol use: Yes    Comment: occa  . Drug use: Never  . Sexual activity: Not on file  Lifestyle  . Physical activity:    Days per week: Not on file    Minutes per session: Not on file  . Stress: Not on file  Relationships  . Social connections:    Talks on phone: Not on file    Gets together: Not on file    Attends religious service: Not on file    Active member of club or organization: Not on file     Attends meetings of clubs or organizations: Not on file    Relationship status: Not on file  . Intimate partner violence:    Fear of current or ex partner: Not on file    Emotionally abused: Not on file    Physically abused: Not on file    Forced sexual activity: Not on file  Other Topics Concern  . Not on file  Social History Narrative  . Not on file    Review of Systems  Constitutional: Negative for fatigue.  HENT: Negative.   Eyes: Negative.   Respiratory: Negative for cough, choking, chest tightness, shortness of breath, wheezing and stridor.   Cardiovascular: Negative.   Gastrointestinal: Negative.     Vitals:   06/05/18 1144  BP: 130/80  Pulse: 82  SpO2: 100%     Physical Exam  Constitutional: He appears well-developed and well-nourished.  HENT:  Head: Normocephalic and atraumatic.  Eyes: Pupils are equal, round, and reactive to light. Conjunctivae and EOM are normal. Right eye exhibits no discharge. Left eye exhibits no discharge.  Neck: Normal range of motion. Neck supple. No tracheal deviation present. No thyromegaly present.  Cardiovascular: Normal rate and regular rhythm. Exam reveals no friction rub.  No murmur heard. Pulmonary/Chest: Effort normal and breath sounds normal. No respiratory distress. He has no wheezes. He has no rales. He exhibits no tenderness.  Abdominal: Soft. Bowel sounds are normal. He exhibits no distension. There is no abdominal tenderness. There is no rebound.  Psychiatric: He has a normal mood and affect.   Data Reviewed: Recent chest x-ray from 12/14 reviewed with the patient PET scan reviewed CT scan that was performed recently reviewed showing improvement-CT was reviewed with the patient Pulmonary function study reviewed with patient showing normal study  Assessment:  Abnormal CT scan of the chest -Right lower lobe mass lesion-smaller in size -Multiple nodules left lower lobe-decreased in size -Overall CT findings much  improved compared to previous  Recent weight loss -Weight loss is stabilized  Recent febrile illness -No longer having febrile episodes, completely back to his usual  Past history of DVT -On Xarelto   Plan/Recommendations: Repeat CT in about 3 months  We will see him back in the office in about 3 months  Encouraged to stay very active Encouraged to call with any significant concerns  The expectation is that he may have some scarring at the area of nodularity  Sherrilyn Rist MD [facility] 06/05/2018, 12:07 PM  CC: London Pepper, MD

## 2018-06-05 NOTE — Patient Instructions (Signed)
Studies are looking much better  Your breathing study as were reviewed Your CT scan as were reviewed  We will repeat CT in 3 months I will see you following the study  Call with any significant changes

## 2018-06-05 NOTE — Progress Notes (Signed)
Ct c 

## 2018-06-06 LAB — ACID FAST CULTURE WITH REFLEXED SENSITIVITIES (MYCOBACTERIA): Acid Fast Culture: NEGATIVE

## 2018-06-13 DIAGNOSIS — M5441 Lumbago with sciatica, right side: Secondary | ICD-10-CM | POA: Diagnosis not present

## 2018-07-25 DIAGNOSIS — R030 Elevated blood-pressure reading, without diagnosis of hypertension: Secondary | ICD-10-CM | POA: Diagnosis not present

## 2018-07-25 DIAGNOSIS — Z86718 Personal history of other venous thrombosis and embolism: Secondary | ICD-10-CM | POA: Diagnosis not present

## 2018-07-25 DIAGNOSIS — N400 Enlarged prostate without lower urinary tract symptoms: Secondary | ICD-10-CM | POA: Diagnosis not present

## 2018-07-25 DIAGNOSIS — M549 Dorsalgia, unspecified: Secondary | ICD-10-CM | POA: Diagnosis not present

## 2018-07-25 DIAGNOSIS — L309 Dermatitis, unspecified: Secondary | ICD-10-CM | POA: Diagnosis not present

## 2018-08-15 DIAGNOSIS — R030 Elevated blood-pressure reading, without diagnosis of hypertension: Secondary | ICD-10-CM | POA: Diagnosis not present

## 2018-08-15 DIAGNOSIS — M5441 Lumbago with sciatica, right side: Secondary | ICD-10-CM | POA: Diagnosis not present

## 2018-08-16 DIAGNOSIS — M25561 Pain in right knee: Secondary | ICD-10-CM | POA: Diagnosis not present

## 2018-08-16 DIAGNOSIS — M1711 Unilateral primary osteoarthritis, right knee: Secondary | ICD-10-CM | POA: Diagnosis not present

## 2018-08-27 DIAGNOSIS — M25561 Pain in right knee: Secondary | ICD-10-CM | POA: Diagnosis not present

## 2018-09-06 DIAGNOSIS — M1711 Unilateral primary osteoarthritis, right knee: Secondary | ICD-10-CM | POA: Diagnosis not present

## 2018-09-06 DIAGNOSIS — S83511D Sprain of anterior cruciate ligament of right knee, subsequent encounter: Secondary | ICD-10-CM | POA: Diagnosis not present

## 2018-09-14 DIAGNOSIS — R351 Nocturia: Secondary | ICD-10-CM | POA: Diagnosis not present

## 2018-09-14 DIAGNOSIS — R3912 Poor urinary stream: Secondary | ICD-10-CM | POA: Diagnosis not present

## 2018-09-17 ENCOUNTER — Ambulatory Visit: Payer: Medicare Other | Admitting: Pulmonary Disease

## 2018-09-26 ENCOUNTER — Telehealth: Payer: Self-pay | Admitting: *Deleted

## 2018-09-26 NOTE — Telephone Encounter (Signed)
Script Screening patients for COVID-19 and reviewing new operational procedures  Greeting - The reason I am calling is to share with you some new changes to our processes that are designed to help us keep everyone safe. Is now a good time to speak with you? Patient says "no' - ask them when you can call back and let them know it's important to do this prior to their appointment.  Patient says "yes" - Great, Jamie the first thing I need to do is ask you some screening Questions.  1. To the best of your knowledge, have you been in close contact with any one with a confirmed diagnosis of COVID 19? o No - proceed to next question  2. Have you had any one or more of the following: fever, chills, cough, shortness of breath or any flu-like symptoms? o No - proceed to next question  3. Have you been diagnosed with or have a previous diagnosis of COVID 19? o No - proceed to next question  4. I am going to go over a few other symptoms with you. Please let me know if you are experiencing any of the following: . Ear, nose or throat discomfort . A sore throat . Headache . Muscle pain . Diarrhea . Loss of taste or smell o No - proceed to next question  Thank you for answering these questions. Please know we will ask you these questions or similar questions when you arrive for your appointment and again it's how we are keeping everyone safe. Also, to keep you safe, please use the provided hand sanitizer when you enter the building. Kristain, we are asking everyone in the building to wear a mask because they help us prevent the spread of germs. Do you have a mask of your own, if not, we are happy to provide one for you. The last thing I want to go over with you is the no visitor guidelines. This means no one can attend the appointment with you unless you need physical assistance. I understand this may be different from your past appointments and I know this may be difficult but please know if  someone is driving you we are happy to call them for you once your appointment is over.  [INSERT SITE SPECIFIC CHECK IN PROCEDURES]  Bryndon I've given you a lot of information, what questions do you have about what I've talked about today or your appointment tomorrow? 

## 2018-09-27 ENCOUNTER — Other Ambulatory Visit: Payer: Self-pay

## 2018-09-27 ENCOUNTER — Ambulatory Visit (INDEPENDENT_AMBULATORY_CARE_PROVIDER_SITE_OTHER)
Admission: RE | Admit: 2018-09-27 | Discharge: 2018-09-27 | Disposition: A | Discharge status: Home / Self Care | Payer: Medicare Other | Source: Ambulatory Visit | Attending: Pulmonary Disease | Admitting: Pulmonary Disease

## 2018-09-27 DIAGNOSIS — R911 Solitary pulmonary nodule: Secondary | ICD-10-CM

## 2018-09-27 DIAGNOSIS — R918 Other nonspecific abnormal finding of lung field: Secondary | ICD-10-CM | POA: Diagnosis not present

## 2018-09-30 NOTE — Progress Notes (Deleted)
Virtual Visit via Telephone Note  I connected with@ on 09/30/18 at  9:30 AM EDT by telephone and verified that I am speaking with the correct person using two identifiers.  Location: Patient: Home Provider: [facility] - [address], [place], [place], [place] [address]   I discussed the limitations, risks, security and privacy concerns of performing an evaluation and management service by telephone and the availability of in person appointments. I also discussed with the patient that there may be a patient responsible charge related to this service. The patient expressed understanding and agreed to proceed.  Patient consented to consult via telephone: Yes People present and their role in pt care: Pt     History of Present Illness:    Chief complaint:     Observations/Objective:  09/27/2018-CT chest without contrast-significant interval improvement of bibasilar masslike and nodular pulmonary opacities which are now predominantly bandlike and most consistent with scarring or atelectasis in particular left lower lobe nodule has almost completely resolved, unchanged mild nonspecific groundglass of the right upper lobe 06/04/2018-CT chest without contrast- improving right lower lobe mass now measuring 2.1 x 3.8 cm favoring postinfectious or inflammatory scarring, consider single follow-up CT chest in 3 months  04/23/2018- ultrasound-guided right thoracentesis yielding 420 mL's of pleural fluid  04/17/2018-CT chest without contrast-interval decrease in the right and left lobe areas of nodular consolidation suggesting infectious or inflammatory etiology  04/04/2018-PET scan- dominant right lower lobe mass and 3 left lower lobe pulmonary pulmonary nodules are hypermetabolic although potentially inflammatory these have not significantly changed from the CT of 03/19/2018 and are suspicious for metastatic lung cancer, small mildly hypermetabolic subcarinal and right hilar lymph nodes,  enlarging right pleural effusion increasing dependent right lower lobe airspace disease  06/05/2018- pulmonary function test- FVC 4.44 (96% predicted), postbronchodilator ratio 83, postbronchodilator FEV1 3.67 (108% predicted), no bronchodilator response, DLCO 93  Assessment and Plan:  No problem-specific Assessment & Plan notes found for this encounter.   Follow Up Instructions:  No follow-ups on file.   I discussed the assessment and treatment plan with the patient. The patient was provided an opportunity to ask questions and all were answered. The patient agreed with the plan and demonstrated an understanding of the instructions.   The patient was advised to call back or seek an in-person evaluation if the symptoms worsen or if the condition fails to improve as anticipated.  I provided *** minutes of non-face-to-face time during this encounter.   Lauraine Rinne, NP

## 2018-10-01 ENCOUNTER — Encounter: Payer: Self-pay | Admitting: Acute Care

## 2018-10-01 ENCOUNTER — Other Ambulatory Visit: Payer: Self-pay

## 2018-10-01 ENCOUNTER — Ambulatory Visit (INDEPENDENT_AMBULATORY_CARE_PROVIDER_SITE_OTHER): Payer: Medicare Other | Admitting: Acute Care

## 2018-10-01 DIAGNOSIS — R918 Other nonspecific abnormal finding of lung field: Secondary | ICD-10-CM | POA: Diagnosis not present

## 2018-10-01 NOTE — Patient Instructions (Signed)
It was great talking with you today. Your CT Chest shows continued improvement. This is good news  We will re-check a CT Chest  in 6 months ( I have spoken with Dr. Ander Slade, and this is his recommendation) We will schedule you for a follow up appointment with him in 6 months after the CT to allow you to review the results with him.  Continue taking Covid precautions as you have been doing.  Call the office if you need [place] sooner.  Please contact office for sooner follow up if symptoms do not improve or worsen or seek emergency care

## 2018-10-01 NOTE — Progress Notes (Signed)
Virtual Visit via Telephone Note  I connected with Craig [name] on 10/01/18 at  9:30 AM EDT by telephone and verified that I am speaking with the correct person using two identifiers.  I  confirmed date of birth and address to authenticate patient  Identity. My nurse Quentin Ore reviewed medications and ordered any refills required.  Location: Patient: At home Provider: At [facility] , [place], [place], [address]   I discussed the limitations, risks, security and privacy concerns of performing an evaluation and management service by telephone and the availability of in person appointments. I also discussed with the patient that there may be a patient responsible charge related to this service. The patient expressed understanding and agreed to proceed.   History of Present Illness: Pt. Presents for a tele visit follow up. He was last seen by Dr. Ander Slade 06/05/2018 for an  Abnormal CT scan of the chest showing a right right lower lobe mass lesion, pleural-based small nodules on the left . He had a significant febrile  illness 02/27/2018 . He was treated with a total of 3 courses of antibiotics. He did have some pleuritic chest pain and lost about 13 pounds despite good appetite. Initial CT Chest was + for right effusion and nodular consolidation, suggestive of post inflammatory vs infectious etiology.  He was status post right thoracentesis on 04/23/2018 that yielded 420 cc's of slightly hazy amber fluid. Fluid was exudative, and cytology : - NO MALIGNANT CELLS IDENTIFIED- MIXED INFLAMMATORY CELLS. His CT scan shows continued improvement.We discussed the results and he verbalized understanding. He states he is back to his baseline..  He has regained his weight. He feels good. He feels he needs to exercise more, but is limited due to [place]. He wonders if the illness he had in December could have been a COVID illness. We reviewed continued COVID precautions including wearing  masks and frequent hand washing. He states he and his wife are limiting their exposures by staying home. He denies any other pulmonary issues, no fever, chest pain, orthopnea or hemoptysis. .     Observations/Objective: 09/27/2018 CT Chest w/o Contrast Mediastinum/Nodes: No change in prominent although not pathologically enlarged mediastinal lymph nodes. Thyroid gland, trachea, and esophagus demonstrate no significant findings.  Lungs/Pleura: Significant interval improvement of bibasilar masslike and nodular pulmonary opacities, which are now predominantly bandlike and most consistent with scarring or atelectasis. In particular, left lower lobe nodule has almost completely resolved (series 3, image 25). Redemonstrated postoperative findings of wedge resection of the right lower lobe opacity (series 3, image 115). Unchanged, mild, nonspecific ground-glass of the right upper lobe (series 3, image 77). No pleural effusion or pneumothorax.  Impression: Significant interval improvement of bibasilar masslike and nodular pulmonary opacities, which are now predominantly bandlike and most consistent with scarring or atelectasis. In particular, left lower lobe nodule has almost completely resolved (series 3, image 25). Redemonstrated postoperative findings of wedge resection of the right lower lobe opacity (series 3, image 115).  Unchanged, mild, nonspecific ground-glass of the right upper lobe (series 3, image 77).  Thoracentesis 03/2018>> Right pleural fluid  NO MALIGNANT CELLS IDENTIFIED- MIXED INFLAMMATORY CELLS  Assessment and Plan: Abnormal CT Chest after illness 02/2018 Continued improvement with imaging  Back to baseline clinically Plan: Follow up CT Chest without contrast  in 6 months.  Follow up with Dr. Ander Slade after 6 month scan. Continue to take precautions to protect against COVID. Please contact office for sooner follow up if symptoms  do not improve or worsen or seek  emergency care   Follow Up Instructions:    I discussed the assessment and treatment plan with the patient. The patient was provided an opportunity to ask questions and all were answered. The patient agreed with the plan and demonstrated an understanding of the instructions.   The patient was advised to call back or seek an in-person evaluation if the symptoms worsen or if the condition fails to improve as anticipated.  I provided 22 minutes of non-face-to-face time during this encounter.   Magdalen Spatz, NP 10/01/2018 10:16 AM

## 2018-10-03 DIAGNOSIS — R030 Elevated blood-pressure reading, without diagnosis of hypertension: Secondary | ICD-10-CM | POA: Diagnosis not present

## 2018-10-03 DIAGNOSIS — Z86718 Personal history of other venous thrombosis and embolism: Secondary | ICD-10-CM | POA: Diagnosis not present

## 2018-10-04 NOTE — Telephone Encounter (Signed)
Dr. Olalere - please advise. Thanks. 

## 2018-12-16 ENCOUNTER — Encounter (HOSPITAL_COMMUNITY): Payer: Self-pay | Admitting: Emergency Medicine

## 2018-12-16 ENCOUNTER — Emergency Department (HOSPITAL_COMMUNITY): Payer: Medicare Other

## 2018-12-16 ENCOUNTER — Other Ambulatory Visit: Payer: Self-pay

## 2018-12-16 ENCOUNTER — Emergency Department (HOSPITAL_COMMUNITY)
Admission: EM | Admit: 2018-12-16 | Discharge: 2018-12-16 | Disposition: A | Discharge status: Home / Self Care | Payer: Medicare Other | Attending: Emergency Medicine | Admitting: Emergency Medicine

## 2018-12-16 DIAGNOSIS — Z79899 Other long term (current) drug therapy: Secondary | ICD-10-CM | POA: Diagnosis not present

## 2018-12-16 DIAGNOSIS — N132 Hydronephrosis with renal and ureteral calculous obstruction: Secondary | ICD-10-CM | POA: Insufficient documentation

## 2018-12-16 DIAGNOSIS — Z7901 Long term (current) use of anticoagulants: Secondary | ICD-10-CM | POA: Diagnosis not present

## 2018-12-16 DIAGNOSIS — R109 Unspecified abdominal pain: Secondary | ICD-10-CM

## 2018-12-16 LAB — URINALYSIS, ROUTINE W REFLEX MICROSCOPIC
Bilirubin Urine: NEGATIVE
Glucose, UA: NEGATIVE mg/dL
Ketones, ur: NEGATIVE mg/dL
Leukocytes,Ua: NEGATIVE
Nitrite: NEGATIVE
Protein, ur: 100 mg/dL — AB
RBC / HPF: >50 RBC/hpf — ABNORMAL HIGH (ref 0–5)
Specific Gravity, Urine: 1.016 (ref 1.005–1.030)
pH: 5 (ref 5.0–8.0)

## 2018-12-16 MED ORDER — HYDROCODONE-ACETAMINOPHEN 5-325 MG PO TABS
1.0000 | ORAL_TABLET | Freq: Once | ORAL | Status: AC
  Administered 2018-12-16: 1 via ORAL
  Filled 2018-12-16: qty 1

## 2018-12-16 MED ORDER — HYDROCODONE-ACETAMINOPHEN 5-325 MG PO TABS: 1.0000 | ORAL_TABLET | ORAL | 0 refills | Status: DC | PRN

## 2018-12-16 MED ORDER — FENTANYL CITRATE (PF) 100 MCG/2ML IJ SOLN
100.0000 ug | INTRAMUSCULAR | Status: DC | PRN
  Administered 2018-12-16: 100 ug via INTRAVENOUS
  Filled 2018-12-16 (×2): qty 2

## 2018-12-16 MED ORDER — ONDANSETRON HCL 4 MG/2ML IJ SOLN
4.0000 mg | Freq: Once | INTRAMUSCULAR | Status: AC
  Administered 2018-12-16: 4 mg via INTRAVENOUS
  Filled 2018-12-16: qty 2

## 2018-12-16 NOTE — Discharge Instructions (Addendum)
Call your urologist in the morning for follow-up appointment as soon as possible.  Tell him that you have 2 kidney stones in the right ureter, which are causing partial obstruction.  The largest one is 5 x 7 mm.

## 2018-12-16 NOTE — ED Triage Notes (Signed)
Patient c/o left flank and back pain with hematuria today. Hx kidney stones.

## 2018-12-16 NOTE — ED Provider Notes (Signed)
[facility] DEPT Provider Note   CSN: [id number] Arrival date & time: 12/16/18  1906     History   Chief Complaint Chief Complaint  Patient presents with   Hematuria   Flank Pain    HPI Craig [name] is a 68 y.o. male.     HPI   He presents for evaluation of left flank pain rating to the left lower abdomen.  He also has noted hematuria today.  Last episode of kidney stones was 15 years ago.  He denies fever, vomiting, chills, weakness or dizziness.  There are no other known modifying factors.  Past Medical History:  Diagnosis Date   Multiple lung nodules on CT    Prostatitis     There are no active problems to display for this patient.   Past Surgical History:  Procedure Laterality Date   BACK SURGERY     CERVICAL SPINE SURGERY     IR THORACENTESIS ASP PLEURAL SPACE W/IMG GUIDE  04/23/2018   urinary stent Left         Home Medications    Prior to Admission medications   Medication Sig Start Date End Date Taking? Authorizing Provider  acetaminophen (TYLENOL) 500 MG tablet Take 500 mg by mouth every 6 (six) hours as needed for mild pain or moderate pain.   Yes [provider]  glucosamine-chondroitin 500-400 MG tablet Take 2 tablets by mouth daily.   Yes [provider]  rivaroxaban (XARELTO) 20 MG TABS tablet Take 20 mg by mouth at bedtime.    Yes [provider]  tamsulosin (FLOMAX) 0.4 MG CAPS capsule Take 0.8 mg by mouth daily.   Yes [provider]  traMADol (ULTRAM) 50 MG tablet Take 50 mg by mouth 3 (three) times daily as needed (pain).   Yes [provider]  HYDROcodone-acetaminophen (NORCO/VICODIN) 5-325 MG tablet Take 1 tablet by mouth every 4 (four) hours as needed for moderate pain. 12/16/18   Daleen Bo, MD    Family History Family History  Problem Relation Age of Onset   Colon cancer Mother     Social History Social History   Tobacco Use   Smoking status:  Never Smoker   Smokeless tobacco: Never Used  Substance Use Topics   Alcohol use: Yes    Comment: occa   Drug use: Never     Allergies   Gentamycin [gentamicin], Nsaids, and Vancomycin   Review of Systems Review of Systems  All other systems reviewed and are negative.    Physical Exam Updated Vital Signs BP (!) 193/115 (BP Location: Left Arm)    Pulse 93    Temp 98.6 F (37 C) (Oral)    Resp 19    SpO2 96%   Physical Exam Vitals signs and nursing note reviewed.  Constitutional:      General: He is in acute distress (He is uncomfortable).     Appearance: He is well-developed. He is not ill-appearing, toxic-appearing or diaphoretic.  HENT:     Head: Normocephalic and atraumatic.     Right Ear: External ear normal.     Left Ear: External ear normal.     Nose: No congestion or rhinorrhea.     Mouth/Throat:     Pharynx: No oropharyngeal exudate or posterior oropharyngeal erythema.  Eyes:     Conjunctiva/sclera: Conjunctivae normal.     Pupils: Pupils are equal, round, and reactive to light.  Neck:     Musculoskeletal: Normal range of motion and neck supple.  Trachea: Phonation normal.  Cardiovascular:     Rate and Rhythm: Normal rate and regular rhythm.     Heart sounds: Normal heart sounds.  Pulmonary:     Effort: Pulmonary effort is normal. No respiratory distress.     Breath sounds: Normal breath sounds. No stridor.  Abdominal:     General: There is no distension.     Palpations: Abdomen is soft.     Tenderness: There is no abdominal tenderness.  Musculoskeletal: Normal range of motion.  Skin:    General: Skin is warm and dry.  Neurological:     Mental Status: He is alert and oriented to person, place, and time.     Cranial Nerves: No cranial nerve deficit.     Sensory: No sensory deficit.     Motor: No abnormal muscle tone.     Coordination: Coordination normal.  Psychiatric:        Mood and Affect: Mood normal.        Behavior: Behavior normal.          Thought Content: Thought content normal.        Judgment: Judgment normal.      ED Treatments / Results  Labs (all labs ordered are listed, but only abnormal results are displayed) Labs Reviewed  URINALYSIS, ROUTINE W REFLEX MICROSCOPIC - Abnormal; Notable for the following components:      Result Value   Color, Urine AMBER (*)    APPearance CLOUDY (*)    Hgb urine dipstick LARGE (*)    Protein, ur 100 (*)    RBC / HPF >50 (*)    Bacteria, UA RARE (*)    All other components within normal limits    EKG None  Radiology Ct Renal Stone Study  Result Date: 12/16/2018 CLINICAL DATA:  Left-sided flank pain EXAM: CT ABDOMEN AND PELVIS WITHOUT CONTRAST TECHNIQUE: Multidetector CT imaging of the abdomen and pelvis was performed following the standard protocol without IV contrast. COMPARISON:  CT 03/19/2018, chest CT 09/27/2018 FINDINGS: Lower chest: Lung bases demonstrate no acute consolidation or pleural effusion. Bandlike density in the right lung base, presumed scarring shows no change as compared with 09/27/2018. Heart size within normal limits. Hepatobiliary: No focal liver abnormality is seen. No gallstones, gallbladder wall thickening, or biliary dilatation. Pancreas: Unremarkable. No pancreatic ductal dilatation or surrounding inflammatory changes. Spleen: Normal in size without focal abnormality. Adrenals/Urinary Tract: Adrenal glands are normal. Cortical scarring within the mid to upper left kidney. Multiple intrarenal stones on the left, measuring up to 9 mm in size. Mild left hydronephrosis and hydroureter, secondary to 2 adjacent stones in the proximal left ureter about 3.5 cm distal to the left UPJ. The more cephalad stone measures 3 mm, the more caudal stone measures 5 x 7 mm. Aggregate craniocaudad measurement of 11 mm. Parapelvic cysts on the right. Small cyst lower pole right kidney. Urinary bladder unremarkable Stomach/Bowel: Stomach is within normal limits. Appendix appears  normal. No evidence of bowel wall thickening, distention, or inflammatory changes. Sigmoid colon diverticular disease without acute inflammatory process. Vascular/Lymphatic: Mild aortic atherosclerosis. No aneurysm. No significantly enlarged lymph nodes. Reproductive: Prostate enlargement with calcification Other: Negative for free air or free fluid Musculoskeletal: No acute or suspicious osseous abnormality. Fused appearance of the vertebral bodies at L4 and L5. Multiple level degenerative change. IMPRESSION: 1. Mild left hydronephrosis and hydroureter, secondary to 2 adjacent stones in the proximal left ureter about 3.5 cm distal to the left UPJ. Stones are seen over and  11 mm craniocaudad diameter, cephalad stone measures 3 mm and caudal stone measures 5 x 7 mm. 2. Scarring within the left kidney. Multiple intrarenal stones on the left. 3. Enlarged prostate 4. Sigmoid colon diverticular disease without acute inflammatory process Electronically Signed   By: Donavan Foil M.D.   On: 12/16/2018 20:23    Procedures Procedures (including critical care time)  Medications Ordered in ED Medications  fentaNYL (SUBLIMAZE) injection 100 mcg (100 mcg Intravenous Given 12/16/18 2103)  HYDROcodone-acetaminophen (NORCO/VICODIN) 5-325 MG per tablet 1 tablet (has no administration in time range)  ondansetron (ZOFRAN) injection 4 mg (4 mg Intravenous Given 12/16/18 2102)     Initial Impression / Assessment and Plan / ED Course  I have reviewed the triage vital signs and the nursing notes.  Pertinent labs & imaging results that were available during my care of the patient were reviewed by me and considered in my medical decision making (see chart for details).  Clinical Course as of Dec 15 2140  Sun Dec 16, 2018  2050 Abnormal, presence of blood, protein, RBCs and bacteria  Urinalysis, Routine w reflex microscopic- may I&O cath if menses(!) [EW]  2050 Abnormal, high  BP(!): 192/121 [EW]  2050 Abnormal, images  reviewed, left ureter stones, with left hydronephrosis and left ureter enlargement proximal to the stones.  Images reviewed by  CT Renal Laren Everts [EW]    Clinical Course User Index [EW] Daleen Bo, MD        Patient Vitals for the past 24 hrs:  BP Temp Temp src Pulse Resp SpO2  12/16/18 2053 (!) 193/115 98.6 F (37 C) Oral 93 -- 96 %  12/16/18 1920 (!) 192/121 98.5 F (36.9 C) Oral 85 19 100 %    9:38 PM Reevaluation with update and discussion. After initial assessment and treatment, an updated evaluation reveals he states that his pain has resolved at this time.  He is ready to go home.  Findings discussed and questions answered. Daleen Bo   Medical Decision Making: Left flank pain secondary to two partially obstructing kidney stones left ureter.  Doubt UTI, sepsis or impending vascular collapse.  Patient is on Flomax, chronically  CRITICAL CARE-no Performed by: Daleen Bo  Nursing Notes Reviewed/ Care Coordinated Applicable Imaging Reviewed Interpretation of Laboratory Data incorporated into ED treatment  The patient appears reasonably screened and/or stabilized for discharge and I doubt any other medical condition or other [facility] requiring further screening, evaluation, or treatment in the ED at this time prior to discharge.  Plan: Home Medications-continue usual; Home Treatments-rest, fluids; return here if the recommended treatment, does not improve the symptoms; Recommended follow up-call urology tomorrow morning for follow-up appointment, this week.    Final Clinical Impressions(s) / ED Diagnoses   Final diagnoses:  Flank pain  Ureteral stone with hydronephrosis    ED Discharge Orders         Ordered    HYDROcodone-acetaminophen (NORCO/VICODIN) 5-325 MG tablet  Every 4 hours PRN     12/16/18 2141           Daleen Bo, MD 12/16/18 2142

## 2018-12-17 DIAGNOSIS — N202 Calculus of kidney with calculus of ureter: Secondary | ICD-10-CM | POA: Diagnosis not present

## 2018-12-18 DIAGNOSIS — Z23 Encounter for immunization: Secondary | ICD-10-CM | POA: Diagnosis not present

## 2018-12-21 DIAGNOSIS — Z01818 Encounter for other preprocedural examination: Secondary | ICD-10-CM | POA: Diagnosis not present

## 2018-12-24 DIAGNOSIS — Z01812 Encounter for preprocedural laboratory examination: Secondary | ICD-10-CM | POA: Diagnosis not present

## 2018-12-24 DIAGNOSIS — Z7901 Long term (current) use of anticoagulants: Secondary | ICD-10-CM | POA: Diagnosis not present

## 2018-12-24 DIAGNOSIS — Z01818 Encounter for other preprocedural examination: Secondary | ICD-10-CM | POA: Diagnosis not present

## 2018-12-25 ENCOUNTER — Encounter (HOSPITAL_COMMUNITY): Payer: Self-pay | Admitting: *Deleted

## 2018-12-25 ENCOUNTER — Other Ambulatory Visit: Payer: Self-pay | Admitting: Urology

## 2018-12-25 NOTE — Progress Notes (Signed)
SPOKE W/   patient     SCREENING SYMPTOMS OF COVID 19:   COUGH--no  RUNNY NOSE--- no  SORE THROAT---no  NASAL CONGESTION----no  SNEEZING----no  SHORTNESS OF BREATH---no  DIFFICULTY BREATHING---no  TEMP >100.0 -----no UNEXPLAINED BODY ACHES-----no-  CHILLS -------- no  HEADACHES ---------no  LOSS OF SMELL/ TASTE --------no    HAVE YOU OR ANY FAMILY MEMBER TRAVELLED PAST 14 DAYS OUT OF THE   COUNTY---no STATE----no COUNTRY----no  HAVE YOU OR ANY FAMILY MEMBER BEEN EXPOSED TO ANYONE WITH COVID 19? No   Spoke to patient via phone,history obtained,updated.  Bring blue folder,insurance cards,picture ID,designated driver and living will,POA, if desires (to be placed on chart). Reinforced no aspirin(instructions to hold aspirin per your doctor), ibuprofen products 72 hours prior to procedure. No vitamins or herbal medicines 7 days prior to procedure.   Follow laxative instructions provided by urologist (office) and in blue folder. Wear easy on/off clothing and no jewelry except wedding rings and ear rings. Leave all other valuables at home. Verbalizes understanding of instructions  No alcohol 24 hours prior to procedure.call md office if you have a cold,sorethroat or fever. Shower or bathe before your treatment.Please bring your C_PAP/Breathing machine if you use one. NPO after midnight.

## 2018-12-28 NOTE — H&P (Signed)
Office Visit Report     12/17/2018   --------------------------------------------------------------------------------   Craig [name]  MRN: [id number]  DOB: 03-08-1951, 68 year old Male  SSN:    PRIMARY CARE:  Juanell Fairly, MD  REFERRING:  Juanell Fairly, MD  PROVIDER:  Bjorn Loser, M.D.  TREATING:  Jiles Crocker, NP  LOCATION:  [facility] [phone]     --------------------------------------------------------------------------------   CC: I have a history of kidney stones.  HPI: Craig [name] is a 68 year-old male established patient who is here for F/U due to a history of renal calculi.  12/17/2018: Seen in the ED yesterday with left sided pain/discomfort and gross hematuria. Symptoms associated with some nausea, no vomiting. CT imaging noted two adjacent stones in the left proximal ureter with the distal stone measuring ~6.86mm across, the superior stone measuring ~38mm. There was associated upstream hydronephrosis. Pain much less severe today but still present. Primarily in the left lower quadrant radiating into the left flank and lower back. His gross hematuria has resolved. Now back voiding at his baseline as he continues tamsulosin for obstructive voiding symptoms managed by his urologist Dr. Matilde Sprang. He has not seen any interval stone material passage. Denies burning or painful urination. No associated fevers or chills.   The patient's stone was on his left side. He did not pass a stone since the last office visit. The patient has been taking Tamsulosin 0.8mg  for their calculus disease.   The patient has had flank pain since they were last seen. The patient denies any progressive voiding symptoms. He has yes seen blood in his urine since the last visit. He is currently having flank pain and back pain. He denies having groin pain, nausea, vomiting, fever, and chills.     CC/HPI: CTSS from 12/16/2018:   IMPRESSION:  1. Mild left hydronephrosis and  hydroureter, secondary to 2 adjacent  stones in the proximal left ureter about 3.5 cm distal to the left  UPJ. Stones are seen over and 11 mm craniocaudad diameter, cephalad  stone measures 3 mm and caudal stone measures 5 x 7 mm.  2. Scarring within the left kidney. Multiple intrarenal stones on  the left.  3. Enlarged prostate  4. Sigmoid colon diverticular disease without acute inflammatory  process     ALLERGIES: Mycin Antibiotics - inner ear damage    MEDICATIONS: Tamsulosin Hcl 0.4 mg capsule 1 capsule PO BID  Glucosamine & Chondroitin  Levofloxacin 500 mg tablet  Potassium Gluconate 550 mg (90 mg) tablet  Tramadol Hcl 50 mg tablet  Xarelto 20 mg tablet     GU PSH: None   NON-GU PSH: Lumbar Spine Fusion - 2001     GU PMH: Nocturia - 03/23/2018 Urinary Frequency - 03/23/2018 Weak Urinary Stream - 03/23/2018    NON-GU PMH: DVT, History Sleep Apnea    FAMILY HISTORY: 2 sons - Son Colon Cancer - Mother Heart Attack - Mother Kidney Stones - Brother Patient's father is deceased - Father Patient's mother is deceased - Mother stroke - Mother   SOCIAL HISTORY: Marital Status: Married Current Smoking Status: Patient has never smoked.   Tobacco Use Assessment Completed: Used Tobacco in last 30 days? Does not use smokeless tobacco. Drinks 3 drinks per week.  Drinks 3 caffeinated drinks per day.    REVIEW OF SYSTEMS:    GU Review Male:   Patient reports frequent urination, get up at night to urinate, and stream starts and stops. Patient denies hard  to postpone urination, burning/ pain with urination, leakage of urine, trouble starting your stream, have to strain to urinate , erection problems, and penile pain.  Gastrointestinal (Upper):   Patient denies nausea, vomiting, and indigestion/ heartburn.  Gastrointestinal (Lower):   Patient denies diarrhea and constipation.  Constitutional:   Patient reports fatigue. Patient denies fever, weight loss, and night sweats.   Skin:   Patient denies skin rash/ lesion and itching.  Eyes:   Patient denies blurred vision and double vision.  Ears/ Nose/ Throat:   Patient denies sore throat and sinus problems.  Hematologic/Lymphatic:   Patient denies swollen glands and easy bruising.  Cardiovascular:   Patient denies leg swelling and chest pains.  Respiratory:   Patient denies cough and shortness of breath.  Endocrine:   Patient denies excessive thirst.  Musculoskeletal:   Patient reports back pain. Patient denies joint pain.  Neurological:   Patient denies headaches and dizziness.  Psychologic:   Patient denies depression and anxiety.   Notes: Updated from previous visit 03/23/2018 with review from patient as noted above.   VITAL SIGNS:      12/17/2018 09:39 AM  Weight 202.3 lb / 91.76 kg  Height 70 in / 177.8 cm  BP 135/74 mmHg  Pulse 89 /min  Temperature 98.2 F / 36.7 C  BMI 29.0 kg/m   MULTI-SYSTEM PHYSICAL EXAMINATION:    Constitutional: Well-nourished. No physical deformities. Normally developed. Good grooming.  Neck: Neck symmetrical, not swollen. Normal tracheal position.  Respiratory: No labored breathing, no use of accessory muscles.   Cardiovascular: Normal temperature, normal extremity pulses, no swelling, no varicosities.  Skin: No paleness, no jaundice, no cyanosis. No lesion, no ulcer, no rash.  Neurologic / Psychiatric: Oriented to time, oriented to place, oriented to person. No depression, no anxiety, no agitation.  Gastrointestinal: No mass, no tenderness, no rigidity, non obese abdomen. No CVA, flank, lower abdominal tenderness.  Musculoskeletal: Normal gait and station of head and neck.     PAST DATA REVIEWED:  Source Of History:  Patient, Medical Record Summary  Records Review:   Previous Hospital Records, Previous Patient Records  Urine Test Review:   Urinalysis  X-Ray Review: KUB: Reviewed Films. Discussed With Patient.  C.T. Stone Protocol: Reviewed Films. Reviewed Report.  Discussed With Patient.     12/17/18  Urinalysis  Urine Appearance Clear   Urine Color Yellow   Urine Glucose Neg mg/dL  Urine Bilirubin Neg mg/dL  Urine Ketones Neg mg/dL  Urine Specific Gravity 1.010   Urine Blood 3+ ery/uL  Urine pH 6.0   Urine Protein Neg mg/dL  Urine Urobilinogen 0.2 mg/dL  Urine Nitrites Neg   Urine Leukocyte Esterase Neg leu/uL  Urine WBC/hpf 0 - 5/hpf   Urine RBC/hpf 0 - 2/hpf   Urine Epithelial Cells NS (Not Seen)   Urine Bacteria Rare (0-9/hpf)   Urine Mucous Not Present   Urine Yeast NS (Not Seen)   Urine Trichomonas Not Present   Urine Cystals NS (Not Seen)   Urine Casts NS (Not Seen)   Urine Sperm Not Present    PROCEDURES:         KUB [id number]  A single view of the abdomen is obtained. Several small nonobstructing calculi visualized within the confines of the left renal shadow. The right renal shadow is grossly obscured by a prominent overlying bowel gas and stool pattern. A few nonobstructing stones are visualized with largest noted within the right lower pole. No obvious right ureteral calculi visualized  on today's exam. Tracing down the anatomical expected tract of the left ureter there is too distinct calculi adjacent to one another just lateral to L3 which correlates with previously identified proximal ureteral calculi on CT imaging. No additional ureteral calculi noted along the course of the left ureter today. His bladder grossly appears free of obstruction.      Patient confirmed No Neulasta OnPro Device.            Urinalysis w/Scope Dipstick Dipstick Cont'd Micro  Color: Yellow Bilirubin: Neg mg/dL WBC/hpf: 0 - 5/hpf  Appearance: Clear Ketones: Neg mg/dL RBC/hpf: 0 - 2/hpf  Specific Gravity: 1.010 Blood: 3+ ery/uL Bacteria: Rare (0-9/hpf)  pH: 6.0 Protein: Neg mg/dL Cystals: NS (Not Seen)  Glucose: Neg mg/dL Urobilinogen: 0.2 mg/dL Casts: NS (Not Seen)    Nitrites: Neg Trichomonas: Not Present    Leukocyte Esterase: Neg leu/uL  Mucous: Not Present      Epithelial Cells: NS (Not Seen)      Yeast: NS (Not Seen)      Sperm: Not Present    ASSESSMENT:      ICD-10 Details  1 GU:   Renal and ureteral calculus - N20.2 Left   PLAN:           Orders Labs Urine Culture  X-Rays: KUB          Schedule Return Visit/Planned Activity: 2 Weeks - Office Visit          Document Letter(s):  Created for Patient: Clinical Summary         Notes:   Two adjacent stone visualized in the left proximal ureter today. Given stone's visibility, he may be an excellent candidate for ESWL. I'll send a message to his urologist regarding this today. If ESWL thought to not be a good choice then he would require URS. I discussed both with him today   For ureteroscopy I described the risks which include heart attack, stroke, pulmonary embolus, death, bleeding, infection, damage to contiguous structures, positioning injury, ureteral stricture, ureteral avulsion, ureteral injury, need for ureteral stent, inability to perform ureteroscopy, need for an interval procedure, inability to clear stone burden, stent discomfort and pain.  For shockwave lithotripsy I described the risks which include arrhythmia, kidney contusion, kidney hemorrhage, need for transfusion, pain, inability to break up stone, inability to pass stone fragments, Steinstrasse, infection associated with obstructing stones, need for different surgical procedure, need for repeat shockwave lithotripsy, and death.   I'll f/u with him once I discuss with his urologist. He will continue tamsulosin at 0.8mg  daily. He will continue straining his urine and that is on remaining as active as possible as well as liberally hydrated. Return precautions discussed for interval stent material passage or worsening symptomology.        Next Appointment:      Next Appointment: 12/31/2018 08:45 AM    Appointment Type: Office Visit Established Patient    Location: [facility] [phone]  [id number]    Provider: Jiles Crocker, NP    Reason for Visit: 2 week f/u      ** Signed by Jiles Crocker, NP on 12/18/18 at 4:24 PM (EDT)**     The information contained in this medical record document is considered private and confidential patient information. This information can only be used for the medical diagnosis and/or medical services that are being provided by the patient's selected caregivers. This information can only be distributed outside of the patient's care if the patient agrees and  signs waivers of authorization for this information to be sent to an outside source or route.

## 2018-12-31 ENCOUNTER — Ambulatory Visit (HOSPITAL_COMMUNITY)
Admission: RE | Admit: 2018-12-31 | Discharge: 2018-12-31 | Disposition: A | Discharge status: Home / Self Care | Payer: Medicare Other | Attending: Urology | Admitting: Urology

## 2018-12-31 ENCOUNTER — Encounter (HOSPITAL_COMMUNITY)
Admission: RE | Disposition: A | Discharge status: Home / Self Care | Payer: Self-pay | Source: Home / Self Care | Attending: Urology

## 2018-12-31 ENCOUNTER — Telehealth (HOSPITAL_COMMUNITY): Payer: Self-pay | Admitting: *Deleted

## 2018-12-31 ENCOUNTER — Ambulatory Visit (HOSPITAL_COMMUNITY): Payer: Medicare Other

## 2018-12-31 ENCOUNTER — Encounter (HOSPITAL_COMMUNITY): Payer: Self-pay | Admitting: General Practice

## 2018-12-31 DIAGNOSIS — N201 Calculus of ureter: Secondary | ICD-10-CM | POA: Insufficient documentation

## 2018-12-31 DIAGNOSIS — Z79899 Other long term (current) drug therapy: Secondary | ICD-10-CM | POA: Insufficient documentation

## 2018-12-31 DIAGNOSIS — Z01818 Encounter for other preprocedural examination: Secondary | ICD-10-CM | POA: Diagnosis not present

## 2018-12-31 DIAGNOSIS — N1330 Unspecified hydronephrosis: Secondary | ICD-10-CM | POA: Insufficient documentation

## 2018-12-31 DIAGNOSIS — Z7901 Long term (current) use of anticoagulants: Secondary | ICD-10-CM | POA: Insufficient documentation

## 2018-12-31 DIAGNOSIS — N401 Enlarged prostate with lower urinary tract symptoms: Secondary | ICD-10-CM | POA: Diagnosis not present

## 2018-12-31 DIAGNOSIS — E669 Obesity, unspecified: Secondary | ICD-10-CM | POA: Diagnosis not present

## 2018-12-31 DIAGNOSIS — Z6828 Body mass index (BMI) 28.0-28.9, adult: Secondary | ICD-10-CM | POA: Insufficient documentation

## 2018-12-31 HISTORY — DX: Personal history of urinary calculi: Z87.442

## 2018-12-31 HISTORY — PX: EXTRACORPOREAL SHOCK WAVE LITHOTRIPSY: SHX1557

## 2018-12-31 HISTORY — DX: Sleep apnea, unspecified: G47.30

## 2018-12-31 HISTORY — DX: Family history of other specified conditions: Z84.89

## 2018-12-31 SURGERY — LITHOTRIPSY, ESWL
Anesthesia: LOCAL | Laterality: Left

## 2018-12-31 MED ORDER — SODIUM CHLORIDE 0.9 % IV SOLN
INTRAVENOUS | Status: DC
  Administered 2018-12-31: 08:00:00 via INTRAVENOUS

## 2018-12-31 MED ORDER — DIAZEPAM 5 MG PO TABS
10.0000 mg | ORAL_TABLET | ORAL | Status: AC
  Administered 2018-12-31: 07:00:00 10 mg via ORAL
  Filled 2018-12-31: qty 2

## 2018-12-31 MED ORDER — DIPHENHYDRAMINE HCL 25 MG PO CAPS
25.0000 mg | ORAL_CAPSULE | ORAL | Status: AC
  Administered 2018-12-31: 25 mg via ORAL
  Filled 2018-12-31: qty 1

## 2018-12-31 MED ORDER — CIPROFLOXACIN HCL 500 MG PO TABS
500.0000 mg | ORAL_TABLET | ORAL | Status: AC
  Administered 2018-12-31: 07:00:00 500 mg via ORAL
  Filled 2018-12-31: qty 1

## 2018-12-31 MED ORDER — RIVAROXABAN 20 MG PO TABS: 20.0000 mg | ORAL_TABLET | Freq: Every day | ORAL | Status: DC

## 2018-12-31 NOTE — Op Note (Signed)
Left distal ureteral stone x 2   Left ESWL   Findings: both stone progressed into the left distal ureter compared to the CT and prior office KUB. The 6 mm was more distal at the left UVJ, the 4 mm about a 1 cm more proximal. The UVJ 6 mm was treated and faded. The 4 mm slid down adjacent to it, so the 4 mm is moving. He may need a staged procedure if he fails to pass the fragments/stone.

## 2018-12-31 NOTE — Discharge Instructions (Signed)
Lithotripsy, Care After °This sheet gives you information about how to care for yourself after your procedure. Your health care provider may also give you more specific instructions. If you have problems or questions, contact your health care provider. °What can I expect after the procedure? °After the procedure, it is common to have: °· Some blood in your urine. This should only last for a few days. °· Soreness in your back, sides, or upper abdomen for a few days. °· Blotches or bruises on your back where the pressure wave entered the skin. °· Pain, discomfort, or nausea when pieces (fragments) of the kidney stone move through the tube that carries urine from the kidney to the bladder (ureter). Stone fragments may pass soon after the procedure, but they may continue to pass for up to 4-8 weeks. °? If you have severe pain or nausea, contact your health care provider. This may be caused by a large stone that was not broken up, and this may mean that you need more treatment. °· Some pain or discomfort during urination. °· Some pain or discomfort in the lower abdomen or (in men) at the base of the penis. °Follow these instructions at home: °Medicines °· Take over-the-counter and prescription medicines only as told by your health care provider. °· If you were prescribed an antibiotic medicine, take it as told by your health care provider. Do not stop taking the antibiotic even if you start to feel better. °· Do not drive for 24 hours if you were given a medicine to help you relax (sedative). °· Do not drive or use heavy machinery while taking prescription pain medicine. °Eating and drinking ° °  ° °· Drink enough water and fluids to keep your urine clear or pale yellow. This helps any remaining pieces of the stone to pass. It can also help prevent new stones from forming. °· Eat plenty of fresh fruits and vegetables. °· Follow instructions from your health care provider about eating and drinking restrictions. You may be  instructed: °? To reduce how much salt (sodium) you eat or drink. Check ingredients and nutrition facts on packaged foods and beverages. °? To reduce how much meat you eat. °· Eat the recommended amount of calcium for your age and gender. Ask your health care provider how much calcium you should have. °General instructions °· Get plenty of rest. °· Most people can resume normal activities 1-2 days after the procedure. Ask your health care provider what activities are safe for you. °· Your health care provider may direct you to lie in a certain position (postural drainage) and tap firmly (percuss) over your kidney area to help stone fragments pass. Follow instructions as told by your health care provider. °· If directed, strain all urine through the strainer that was provided by your health care provider. °? Keep all fragments for your health care provider to see. Any stones that are found may be sent to a medical lab for examination. The stone may be as small as a grain of salt. °· Keep all follow-up visits as told by your health care provider. This is important. °Contact a health care provider if: °· You have pain that is severe or does not get better with medicine. °· You have nausea that is severe or does not go away. °· You have blood in your urine longer than your health care provider told you to expect. °· You have more blood in your urine. °· You have pain during urination that does   not go away. °· You urinate more frequently than usual and this does not go away. °· You develop a rash or any other possible signs of an allergic reaction. °Get help right away if: °· You have severe pain in your back, sides, or upper abdomen. °· You have severe pain while urinating. °· Your urine is very dark red. °· You have blood in your stool (feces). °· You cannot pass any urine at all. °· You feel a strong urge to urinate after emptying your bladder. °· You have a fever or chills. °· You develop shortness of breath,  difficulty breathing, or chest pain. °· You have severe nausea that leads to persistent vomiting. °· You faint. °Summary °· After this procedure, it is common to have some pain, discomfort, or nausea when pieces (fragments) of the kidney stone move through the tube that carries urine from the kidney to the bladder (ureter). If this pain or nausea is severe, however, you should contact your health care provider. °· Most people can resume normal activities 1-2 days after the procedure. Ask your health care provider what activities are safe for you. °· Drink enough water and fluids to keep your urine clear or pale yellow. This helps any remaining pieces of the stone to pass, and it can help prevent new stones from forming. °· If directed, strain your urine and keep all fragments for your health care provider to see. Fragments or stones may be as small as a grain of salt. °· Get help right away if you have severe pain in your back, sides, or upper abdomen or have severe pain while urinating. °This information is not intended to replace advice given to you by your health care provider. Make sure you discuss any questions you have with your health care provider. °Document Released: 04/03/2007 Document Revised: 06/25/2018 Document Reviewed: 02/03/2016 °Elsevier Patient Education © 2020 [employer] ° ° °General Anesthesia, Adult, Care After °This sheet gives you information about how to care for yourself after your procedure. Your health care provider may also give you more specific instructions. If you have problems or questions, contact your health care provider. °What can I expect after the procedure? °After the procedure, the following side effects are common: °· Pain or discomfort at the IV site. °· Nausea. °· Vomiting. °· Sore throat. °· Trouble concentrating. °· Feeling cold or chills. °· Weak or tired. °· Sleepiness and fatigue. °· Soreness and body aches. These side effects can affect parts of the body that were  not involved in surgery. °Follow these instructions at home: ° °For at least 24 hours after the procedure: °· Have a responsible adult stay with you. It is important to have someone help care for you until you are awake and alert. °· Rest as needed. °· Do not: °? Participate in activities in which you could fall or become injured. °? Drive. °? Use heavy machinery. °? Drink alcohol. °? Take sleeping pills or medicines that cause drowsiness. °? Make important decisions or sign legal documents. °? Take care of children on your own. °Eating and drinking °· Follow any instructions from your health care provider about eating or drinking restrictions. °· When you feel hungry, start by eating small amounts of foods that are soft and easy to digest (bland), such as toast. Gradually return to your regular diet. °· Drink enough fluid to keep your urine pale yellow. °· If you vomit, rehydrate by drinking water, juice, or clear broth. °General instructions °· If you   have sleep apnea, surgery and certain medicines can increase your risk for breathing problems. Follow instructions from your health care provider about wearing your sleep device: °? Anytime you are sleeping, including during daytime naps. °? While taking prescription pain medicines, sleeping medicines, or medicines that make you drowsy. °· Return to your normal activities as told by your health care provider. Ask your health care provider what activities are safe for you. °· Take over-the-counter and prescription medicines only as told by your health care provider. °· If you smoke, do not smoke without supervision. °· Keep all follow-up visits as told by your health care provider. This is important. °Contact a health care provider if: °· You have nausea or vomiting that does not get better with medicine. °· You cannot eat or drink without vomiting. °· You have pain that does not get better with medicine. °· You are unable to pass urine. °· You develop a skin  rash. °· You have a fever. °· You have redness around your IV site that gets worse. °Get help right away if: °· You have difficulty breathing. °· You have chest pain. °· You have blood in your urine or stool, or you vomit blood. °Summary °· After the procedure, it is common to have a sore throat or nausea. It is also common to feel tired. °· Have a responsible adult stay with you for the first 24 hours after general anesthesia. It is important to have someone help care for you until you are awake and alert. °· When you feel hungry, start by eating small amounts of foods that are soft and easy to digest (bland), such as toast. Gradually return to your regular diet. °· Drink enough fluid to keep your urine pale yellow. °· Return to your normal activities as told by your health care provider. Ask your health care provider what activities are safe for you. °This information is not intended to replace advice given to you by your health care provider. Make sure you discuss any questions you have with your health care provider. °Document Released: 06/20/2000 Document Revised: 03/17/2017 Document Reviewed: 10/28/2016 °Elsevier Patient Education © 2020 [employer] ° ° °

## 2018-12-31 NOTE — Interval H&P Note (Signed)
History and Physical Interval Note:  12/31/2018 9:24 AM  Craig [name]  has presented today for surgery, with the diagnosis of LEFT URETERAL CALCULI.  The various methods of treatment have been discussed with the patient and family. After consideration of risks, benefits and other options for treatment, the patient has consented to  Procedure(s): EXTRACORPOREAL SHOCK WAVE LITHOTRIPSY (ESWL) (Left) as a surgical intervention.  The patient's history has been reviewed, patient examined, no change in status, stable for surgery.  I have reviewed the patient's chart and labs. KUB with stones in left distal ureter. Pt with left flank pain and now LLQ pain but hasnt seen a stone pass. No fever or dysuria.   Questions were answered to the patient's satisfaction.     [name]

## 2019-01-01 ENCOUNTER — Encounter (HOSPITAL_COMMUNITY): Payer: Self-pay | Admitting: Urology

## 2019-01-21 DIAGNOSIS — N202 Calculus of kidney with calculus of ureter: Secondary | ICD-10-CM | POA: Diagnosis not present

## 2019-02-11 DIAGNOSIS — Z125 Encounter for screening for malignant neoplasm of prostate: Secondary | ICD-10-CM | POA: Diagnosis not present

## 2019-02-11 DIAGNOSIS — L309 Dermatitis, unspecified: Secondary | ICD-10-CM | POA: Diagnosis not present

## 2019-02-11 DIAGNOSIS — E785 Hyperlipidemia, unspecified: Secondary | ICD-10-CM | POA: Diagnosis not present

## 2019-02-11 DIAGNOSIS — Z0184 Encounter for antibody response examination: Secondary | ICD-10-CM | POA: Diagnosis not present

## 2019-02-11 DIAGNOSIS — Z0000 Encounter for general adult medical examination without abnormal findings: Secondary | ICD-10-CM | POA: Diagnosis not present

## 2019-02-11 DIAGNOSIS — N400 Enlarged prostate without lower urinary tract symptoms: Secondary | ICD-10-CM | POA: Diagnosis not present

## 2019-02-11 DIAGNOSIS — Z86718 Personal history of other venous thrombosis and embolism: Secondary | ICD-10-CM | POA: Diagnosis not present

## 2019-02-11 DIAGNOSIS — Z23 Encounter for immunization: Secondary | ICD-10-CM | POA: Diagnosis not present

## 2019-02-27 IMAGING — CT CT ABD-PELV W/ CM
3 series · 13 of 32 positions shown, 17 images · IV contrast (APPLIED)
Comparison: None.

CLINICAL DATA: Low-grade fever for 3 weeks. Weight loss. Headache.

EXAM:
CT CHEST, ABDOMEN, AND PELVIS WITH CONTRAST
TECHNIQUE: Multidetector CT imaging of the chest, abdomen and pelvis was
performed following the standard protocol during bolus
administration of intravenous contrast.
CONTRAST:  100mL [id number] IOPAMIDOL ([id number]) INJECTION 61%

[Series 2: chest/abd/pelvis w/cm · axial · 0.84mm/px · z∈[-660,-110]mm · 8 of 134 slices shown]
[im 12/134  soft-tissue]
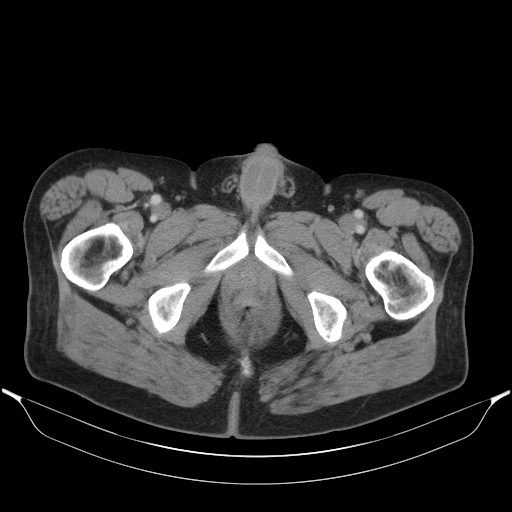
[im 34/134  soft-tissue]
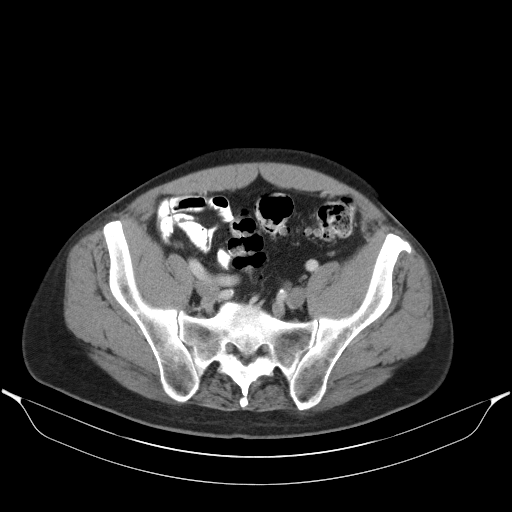
[im 45/134  soft-tissue]
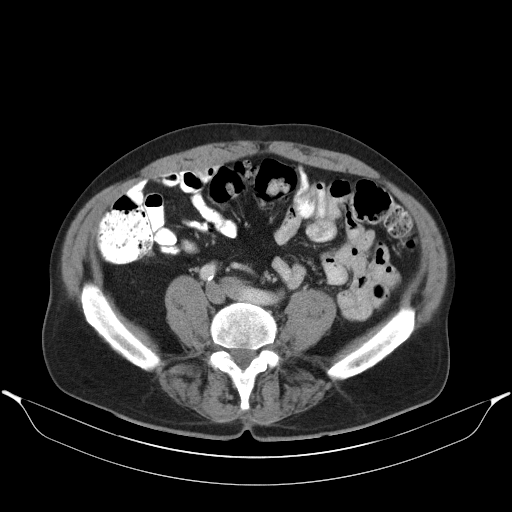
[im 56/134  soft-tissue]
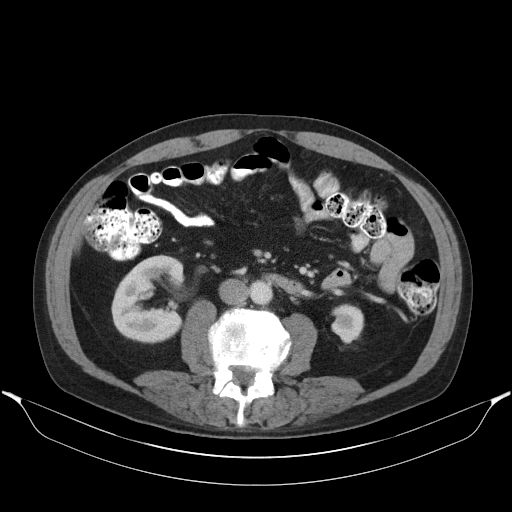
[im 78/134  soft-tissue]
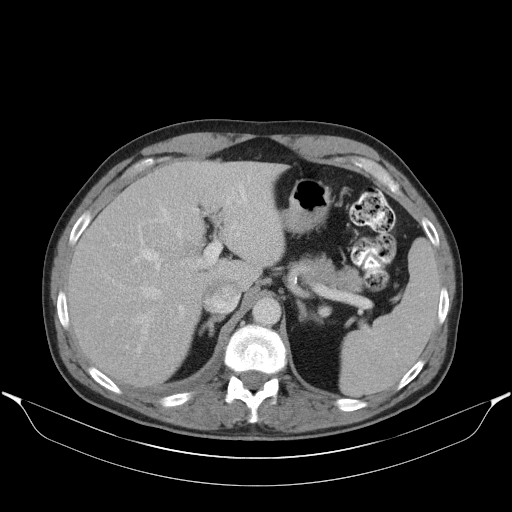
[im 89/134  soft-tissue]
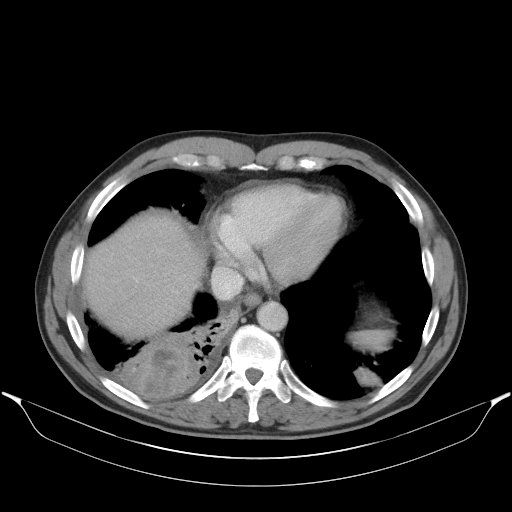
[im 100/134  soft-tissue]
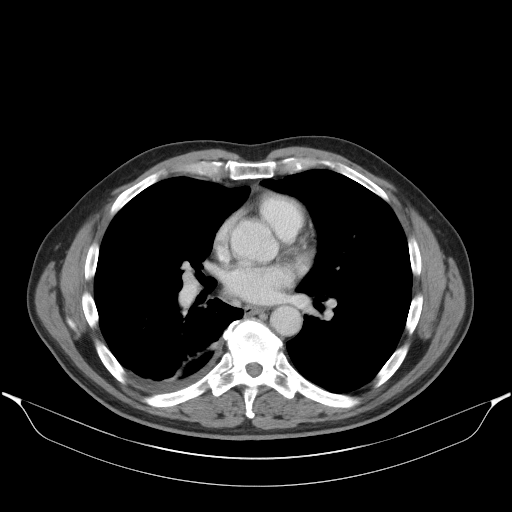
[im 122/134  soft-tissue]
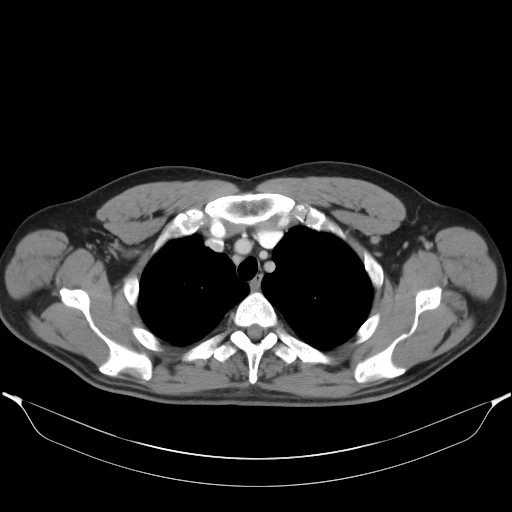

[Series 4: lung · axial · 0.84mm/px · z∈[-330,-284]mm · 2 of 152 slices shown]
[im 12/152  bone]
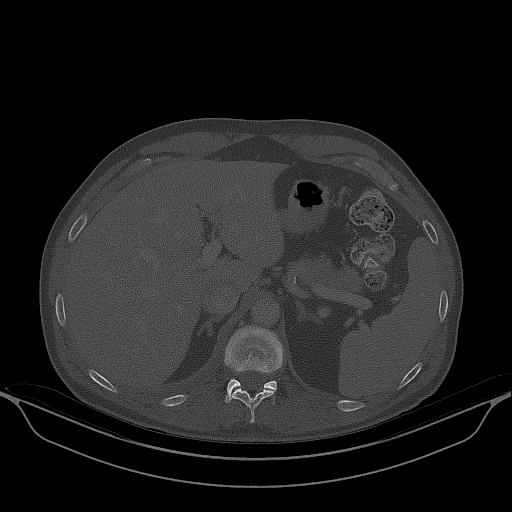
[im 35/152  bone]
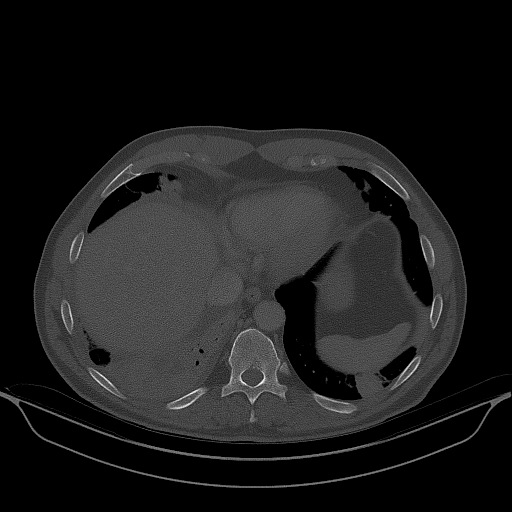

[Series 7: renal delay · axial · delayed · 0.84mm/px · z∈[-488,-363]mm · 3 of 51 slices shown, 7 images]
[im 13/51  soft-tissue]
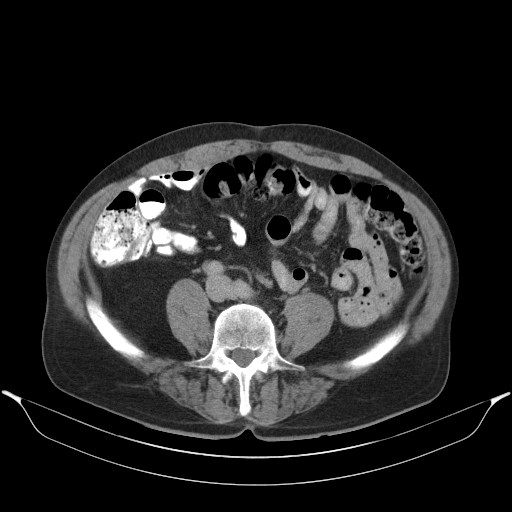
[im 13/51  lung]
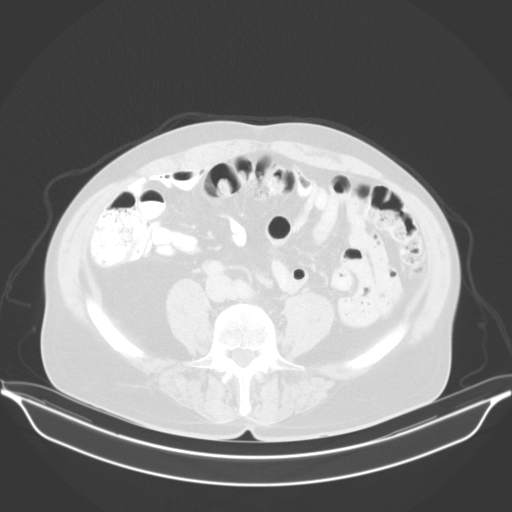
[im 13/51  bone]
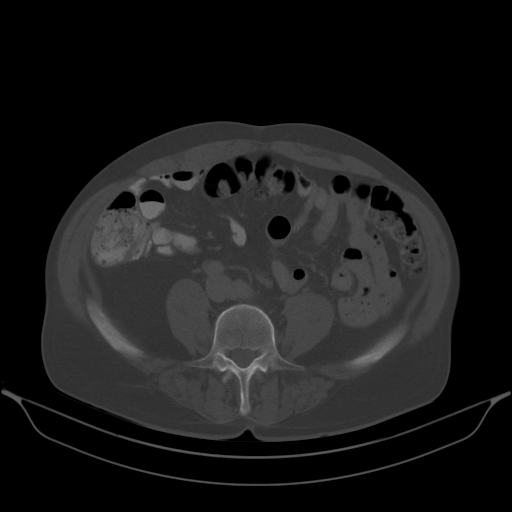
[im 26/51  soft-tissue]
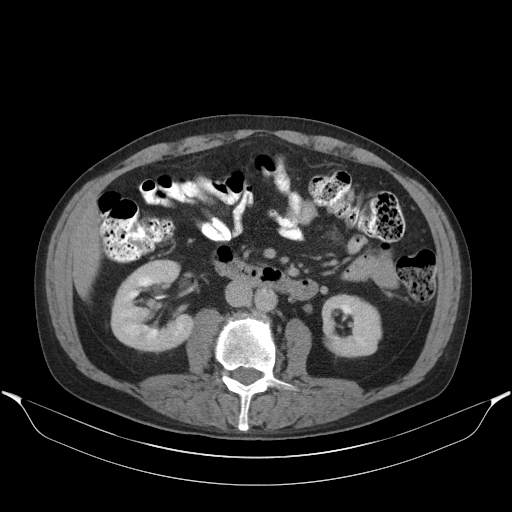
[im 26/51  lung]
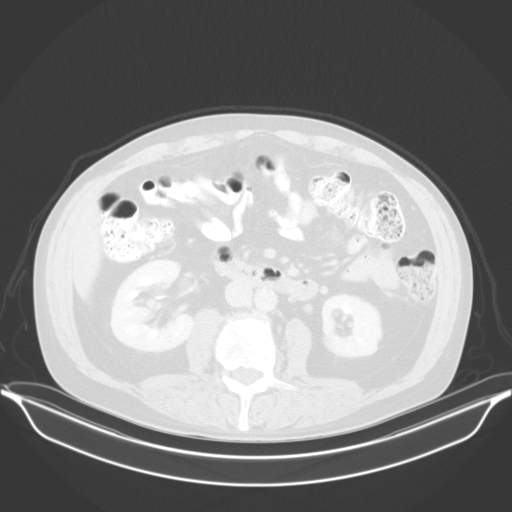
[im 38/51  soft-tissue]
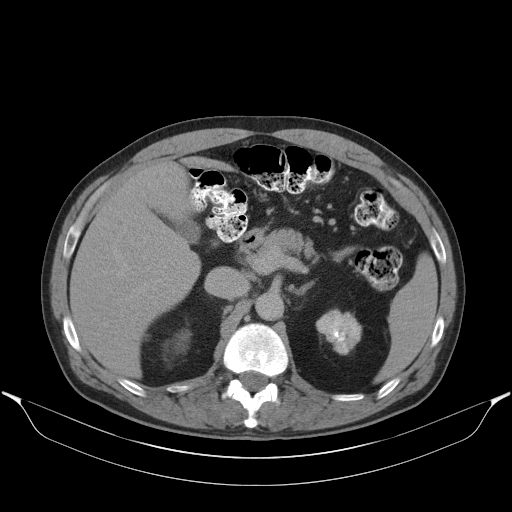
[im 38/51  lung]
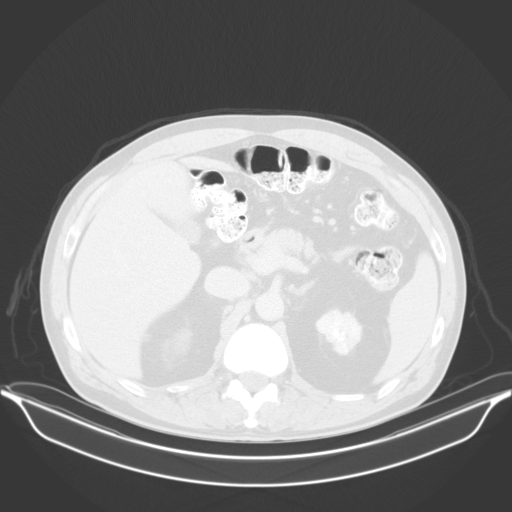

[13 of 32 positions shown; findings below may reference images not displayed]

FINDINGS: CT CHEST FINDINGS

Cardiovascular: Linear calcification along the left anterior
descending coronary artery compatible with atherosclerosis. Trace
anterior pericardial effusion.

Mediastinum/Nodes: Right eccentric subcarinal node 1.2 cm in short
axis on image [DATE]. Right lower paratracheal node 1.0 cm in short
axis, image [DATE]. The more cephalad right paratracheal node measures
0.7 cm in short axis on image [DATE]. Right supraclavicular node
cm in short axis, image [DATE].

Lungs/Pleura: 5.7 by 5.0 by 6.1 cm posterior basal segment right
lower lobe mass with central heterogeneity, image 45/2. Surrounding
airspace opacity noted. Small adjacent right pleural effusion with a
subtle suggestion of enhancement along the parietal pleural margin.

1.5 by 1.2 cm left lower lobe pulmonary nodule, image 93/4.

2.4 by 2.3 cm left lower lobe pulmonary nodule, image 103/4.

2.3 by 2.3 cm left lower lobe pulmonary nodule, image 117/4.

Scattered bandlike atelectasis in both lower lobes, and to a lesser
extent in the lower portions of the lingula and right middle lobe.

No definite signs of chest wall invasion.

Musculoskeletal: Oval-shaped 0.8 by 0.4 cm sclerotic lesion
posterolaterally in the right fourth rib, probably benign/incidental
on image 50/4, but merits surveillance.

CT ABDOMEN PELVIS FINDINGS

Hepatobiliary: 7 mm hypodense lesion in segment 2 of the liver on
image 50/2. 4 mm hypodense lesion in segment 2 of the liver on image
51/2. Both of these lesions are technically too small to
characterize. A similar 0.6 cm lesion is present in segment of the
liver on image 73/2.

The gallbladder appears normal.

Pancreas: Unremarkable

Spleen: Unremarkable

Adrenals/Urinary Tract: Adrenal glands normal.

1.4 cm right kidney lower pole fluid density cyst.

Extensive scattered scarring in the left kidney causing a lobular
appearance. Several left renal calculi including a 1.1 cm calculus
on image 109/5; a 0.4 cm calculus on image 105/5; a 1.1 cm calculus
on image 113/5; and a couple of additional tiny calculi.

Stomach/Bowel: Slight lobularity along the lesser curvature margin
of the proximal stomach on images 50-52 of series 2 could represent
an unusual fold in the stomach surface, or a small mass. Sigmoid
colon diverticulosis.

Vascular/Lymphatic: Aortoiliac atherosclerotic vascular disease.
Borderline enlarged aortocaval node measures 1.0 cm in short axis on
image 69/2.

Reproductive: Moderate prostatomegaly. Punctate calcifications in
the prostate gland.

Other: No supplemental non-categorized findings.

Musculoskeletal: Interbody fusion at L4-5. Degenerative disc disease
at the other lumbar levels along with lumbar spondylosis.
IMPRESSION: 1. 6.1 cm mass in the posterior basal segment right lower lobe with
surrounding airspace opacity, central heterogeneity, and adjacent
small right pleural effusion. Multiple left lower lobe pulmonary
nodules measuring up to 2.4 cm in diameter. Subtle mild mediastinal
adenopathy and borderline enlarged aortocaval lymph node in the
upper abdomen. The appearance is most concerning for metastatic lung
cancer. Non malignant possibilities such as tuberculosis or fungal
pneumonia, or an unusual presentation of cavitary bacterial
pneumonia, are considered possible but less likely. Lobularity along
the lesser curvature of the stomach could be from an unusual fold in
the gastric wall, or from a mass. This could be assessed by
endoscopy or PET-CT.
2. Several small hypodense lesions in the liver are statistically
likely to be benign lesions such as cysts, but technically too small
to characterize.
3. Small oval-shaped 6 mm sclerotic lesion in the right fourth rib
is probably benign but merits surveillance.
4. Other imaging findings of potential clinical significance: Left
anterior descending coronary atherosclerosis. Trace anterior
pericardial effusion. Nonobstructive left nephrolithiasis. Chronic
left renal scarring. Aortoiliac atherosclerotic vascular disease.
Moderate prostatomegaly. Degenerative disc disease and lumbar
spondylosis.

## 2019-03-29 DIAGNOSIS — I2510 Atherosclerotic heart disease of native coronary artery without angina pectoris: Secondary | ICD-10-CM | POA: Insufficient documentation

## 2019-03-29 HISTORY — DX: Atherosclerotic heart disease of native coronary artery without angina pectoris: I25.10

## 2019-04-03 ENCOUNTER — Ambulatory Visit
Admission: RE | Admit: 2019-04-03 | Discharge: 2019-04-03 | Disposition: A | Discharge status: Home / Self Care | Payer: Medicare Other | Source: Ambulatory Visit | Attending: Acute Care | Admitting: Acute Care

## 2019-04-03 DIAGNOSIS — R911 Solitary pulmonary nodule: Secondary | ICD-10-CM | POA: Diagnosis not present

## 2019-04-03 DIAGNOSIS — R918 Other nonspecific abnormal finding of lung field: Secondary | ICD-10-CM

## 2019-04-09 ENCOUNTER — Ambulatory Visit: Payer: Medicare Other | Attending: Internal Medicine

## 2019-04-09 DIAGNOSIS — Z20822 Contact with and (suspected) exposure to covid-19: Secondary | ICD-10-CM

## 2019-04-10 LAB — NOVEL CORONAVIRUS, NAA: SARS-CoV-2, NAA: NOT DETECTED

## 2019-04-22 DIAGNOSIS — E785 Hyperlipidemia, unspecified: Secondary | ICD-10-CM | POA: Diagnosis not present

## 2019-04-25 ENCOUNTER — Ambulatory Visit: Payer: Medicare Other

## 2019-04-29 DIAGNOSIS — D225 Melanocytic nevi of trunk: Secondary | ICD-10-CM | POA: Diagnosis not present

## 2019-04-29 DIAGNOSIS — L814 Other melanin hyperpigmentation: Secondary | ICD-10-CM | POA: Diagnosis not present

## 2019-04-29 DIAGNOSIS — L738 Other specified follicular disorders: Secondary | ICD-10-CM | POA: Diagnosis not present

## 2019-04-29 DIAGNOSIS — B351 Tinea unguium: Secondary | ICD-10-CM | POA: Diagnosis not present

## 2019-04-29 DIAGNOSIS — L570 Actinic keratosis: Secondary | ICD-10-CM | POA: Diagnosis not present

## 2019-04-30 ENCOUNTER — Ambulatory Visit: Payer: Medicare Other

## 2019-05-03 ENCOUNTER — Ambulatory Visit: Payer: Medicare Other | Attending: Internal Medicine

## 2019-05-03 DIAGNOSIS — Z23 Encounter for immunization: Secondary | ICD-10-CM

## 2019-05-03 NOTE — Progress Notes (Signed)
   Covid-19 Vaccination Clinic  Name:  Maximiano Schipani    MRN: [id number] DOB: Dec 17, 1950  05/03/2019  Mr. Reisdorf was observed post Covid-19 immunization for 15 minutes without incidence. He was provided with Vaccine Information Sheet and instruction to access the V-Safe system.   Mr. Higinbotham was instructed to call 911 with any severe reactions post vaccine: Marland Kitchen Difficulty breathing  . Swelling of your face and throat  . A fast heartbeat  . A bad rash all over your body  . Dizziness and weakness    Immunizations Administered    Name Date Dose VIS Date Route   Pfizer COVID-19 Vaccine 05/03/2019  4:45 PM 0.3 mL 03/08/2019 Intramuscular   Manufacturer: [place]   Lot: [id number]   [place]: [id number]

## 2019-05-22 DIAGNOSIS — L02212 Cutaneous abscess of back [any part, except buttock]: Secondary | ICD-10-CM | POA: Diagnosis not present

## 2019-05-28 ENCOUNTER — Ambulatory Visit: Payer: Medicare Other | Attending: Internal Medicine

## 2019-05-28 DIAGNOSIS — Z23 Encounter for immunization: Secondary | ICD-10-CM | POA: Insufficient documentation

## 2019-05-28 NOTE — Progress Notes (Signed)
   Covid-19 Vaccination Clinic  Name:  Craig [name]    MRN: [id number] DOB: 1950/09/19  05/28/2019  [name] was observed post Covid-19 immunization for 15 minutes without incident. He was provided with Vaccine Information Sheet and instruction to access the V-Safe system.   [name] was instructed to call 911 with any severe reactions post vaccine: Marland Kitchen Difficulty breathing  . Swelling of face and throat  . A fast heartbeat  . A bad rash all over body  . Dizziness and weakness   Immunizations Administered    Name Date Dose VIS Date Route   Pfizer COVID-19 Vaccine 05/28/2019  4:00 PM 0.3 mL 03/08/2019 Intramuscular   Manufacturer: [place]   Lot: [id number]   [place]: [id number]

## 2019-07-08 DIAGNOSIS — M961 Postlaminectomy syndrome, not elsewhere classified: Secondary | ICD-10-CM | POA: Diagnosis not present

## 2019-07-08 DIAGNOSIS — Z6828 Body mass index (BMI) 28.0-28.9, adult: Secondary | ICD-10-CM | POA: Diagnosis not present

## 2019-07-08 DIAGNOSIS — R030 Elevated blood-pressure reading, without diagnosis of hypertension: Secondary | ICD-10-CM | POA: Diagnosis not present

## 2019-07-08 DIAGNOSIS — M47816 Spondylosis without myelopathy or radiculopathy, lumbar region: Secondary | ICD-10-CM | POA: Diagnosis not present

## 2019-07-16 DIAGNOSIS — M47816 Spondylosis without myelopathy or radiculopathy, lumbar region: Secondary | ICD-10-CM | POA: Diagnosis not present

## 2019-08-07 DIAGNOSIS — M47816 Spondylosis without myelopathy or radiculopathy, lumbar region: Secondary | ICD-10-CM | POA: Diagnosis not present

## 2019-08-08 DIAGNOSIS — E559 Vitamin D deficiency, unspecified: Secondary | ICD-10-CM | POA: Diagnosis not present

## 2019-08-08 DIAGNOSIS — M545 Low back pain: Secondary | ICD-10-CM | POA: Diagnosis not present

## 2019-08-08 DIAGNOSIS — G8929 Other chronic pain: Secondary | ICD-10-CM | POA: Diagnosis not present

## 2019-08-08 DIAGNOSIS — G894 Chronic pain syndrome: Secondary | ICD-10-CM | POA: Diagnosis not present

## 2019-08-08 DIAGNOSIS — M129 Arthropathy, unspecified: Secondary | ICD-10-CM | POA: Diagnosis not present

## 2019-08-08 DIAGNOSIS — Z79899 Other long term (current) drug therapy: Secondary | ICD-10-CM | POA: Diagnosis not present

## 2019-08-12 DIAGNOSIS — G4733 Obstructive sleep apnea (adult) (pediatric): Secondary | ICD-10-CM | POA: Diagnosis not present

## 2019-08-12 DIAGNOSIS — R7309 Other abnormal glucose: Secondary | ICD-10-CM | POA: Diagnosis not present

## 2019-08-12 DIAGNOSIS — M549 Dorsalgia, unspecified: Secondary | ICD-10-CM | POA: Diagnosis not present

## 2019-08-12 DIAGNOSIS — Z86718 Personal history of other venous thrombosis and embolism: Secondary | ICD-10-CM | POA: Diagnosis not present

## 2019-08-12 DIAGNOSIS — E785 Hyperlipidemia, unspecified: Secondary | ICD-10-CM | POA: Diagnosis not present

## 2019-08-22 DIAGNOSIS — M545 Low back pain: Secondary | ICD-10-CM | POA: Diagnosis not present

## 2019-08-22 DIAGNOSIS — G894 Chronic pain syndrome: Secondary | ICD-10-CM | POA: Diagnosis not present

## 2019-08-22 DIAGNOSIS — Z79899 Other long term (current) drug therapy: Secondary | ICD-10-CM | POA: Diagnosis not present

## 2019-08-22 DIAGNOSIS — G8929 Other chronic pain: Secondary | ICD-10-CM | POA: Diagnosis not present

## 2019-10-10 DIAGNOSIS — M25571 Pain in right ankle and joints of right foot: Secondary | ICD-10-CM | POA: Diagnosis not present

## 2019-10-26 DIAGNOSIS — G4733 Obstructive sleep apnea (adult) (pediatric): Secondary | ICD-10-CM | POA: Diagnosis not present

## 2019-11-04 DIAGNOSIS — R55 Syncope and collapse: Secondary | ICD-10-CM | POA: Diagnosis not present

## 2019-12-11 IMAGING — DX DG ABDOMEN 1V
2 series · 2 of 2 positions shown · non-contrast
Comparison: None

CLINICAL DATA: Preoperative evaluation for lithotripsy

EXAM:
ABDOMEN - 1 VIEW

[abdomen kub (1 of 2)]
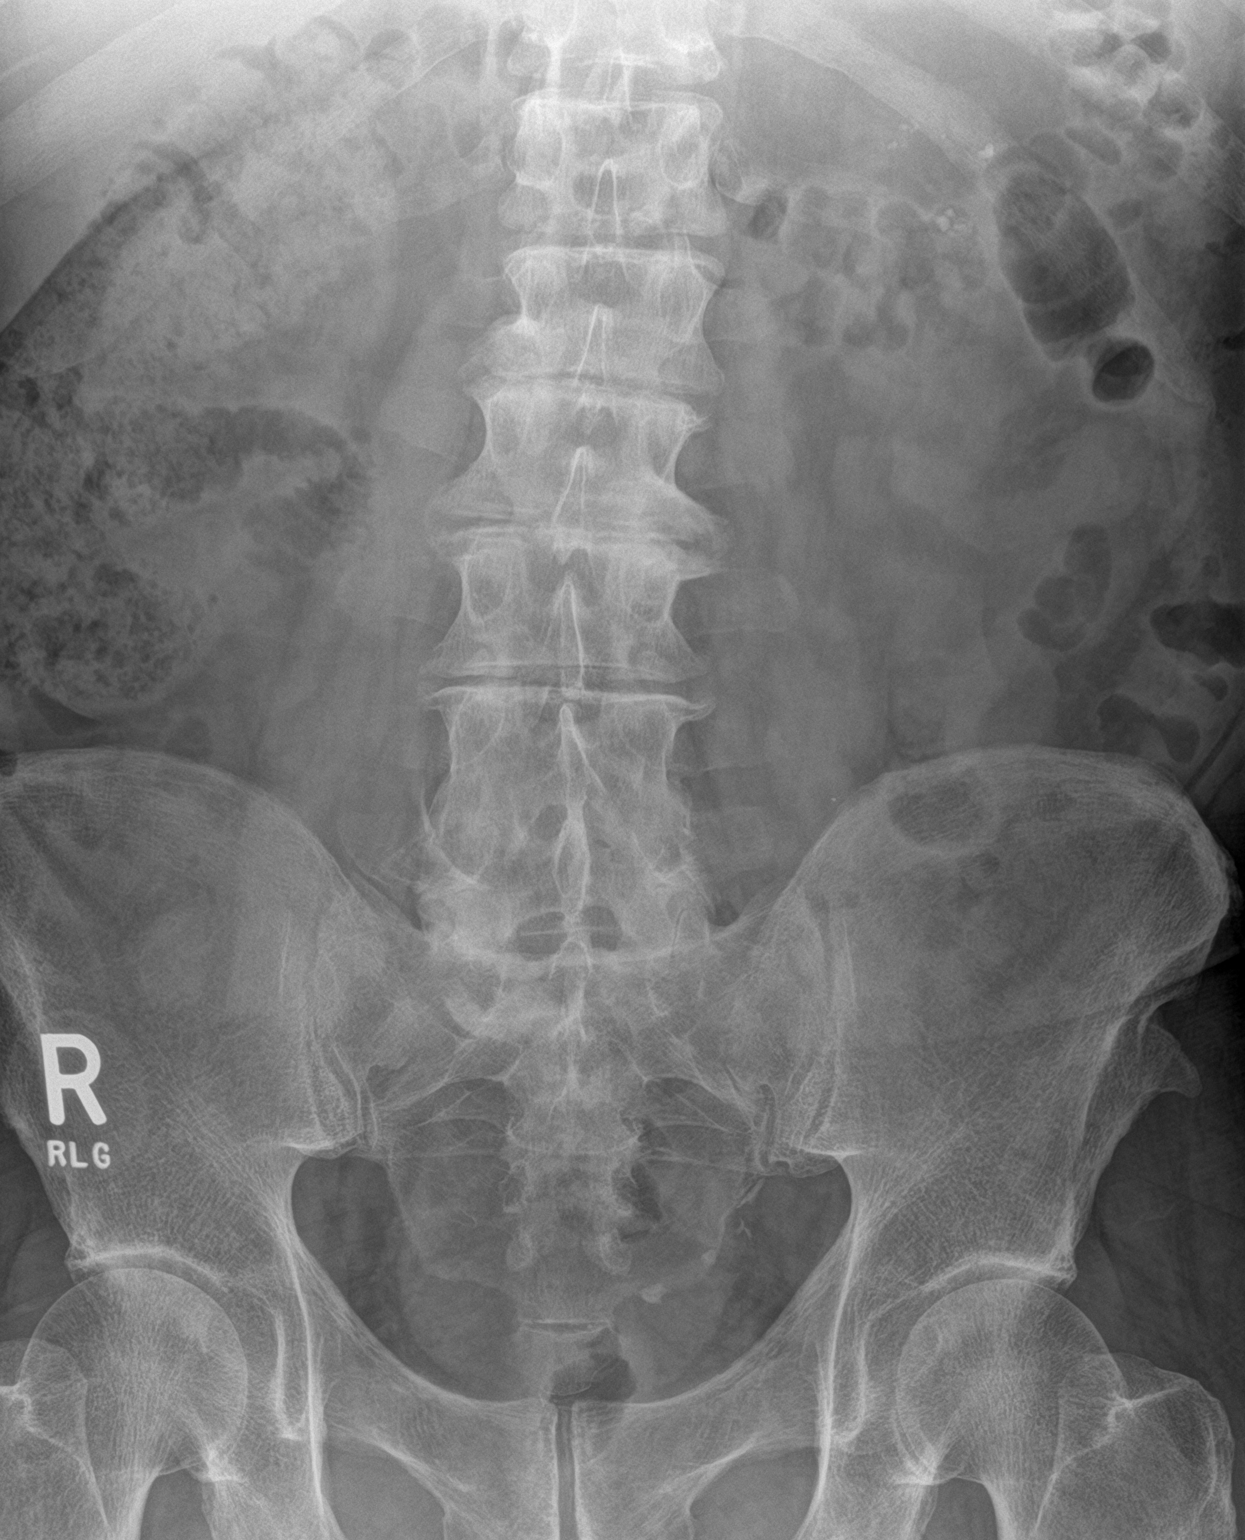

[abdomen kub (2 of 2)]
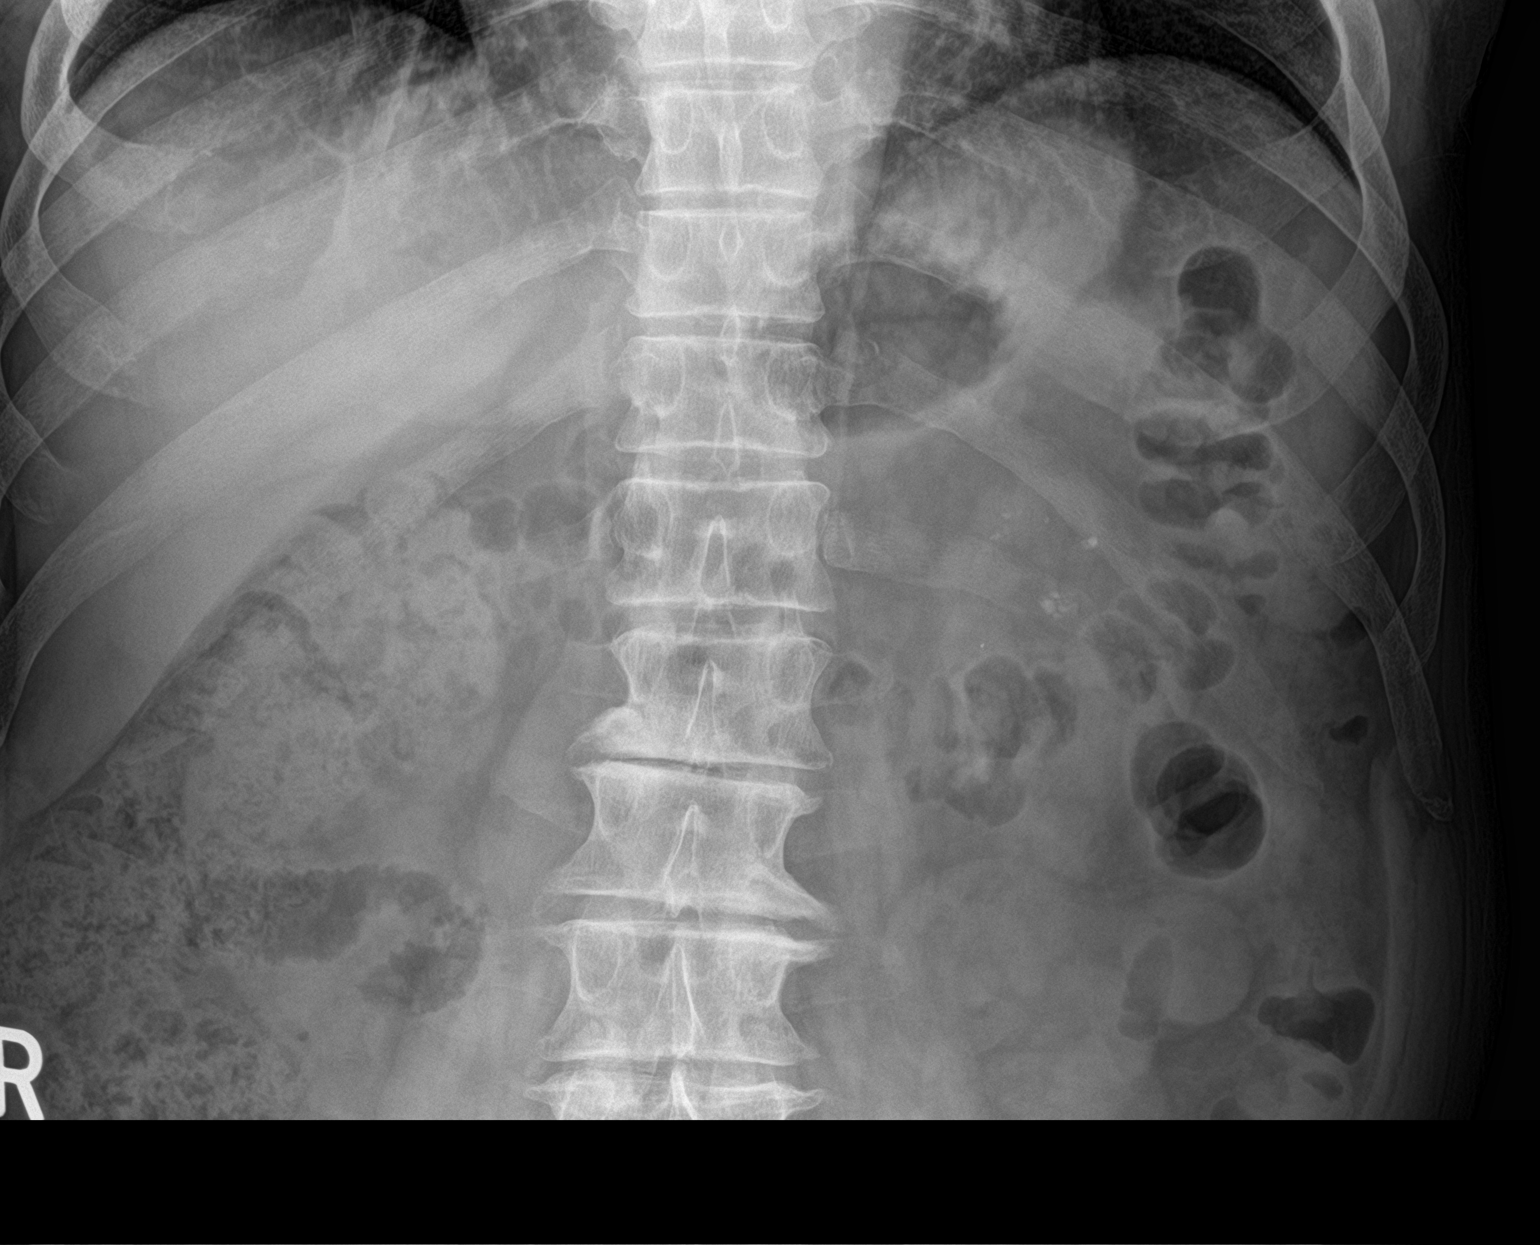

[2 of 2 positions shown; findings below may reference images not displayed]

FINDINGS: Multiple tiny calculi project over the LEFT kidney.

Two calcifications project over the LEFT pelvis, not seen on the
prior exam, highly suspicious for distal LEFT ureteral calculi
measuring 7 x 5 mm and 4 x 3 mm.

On the prior study 1 of these was located at the proximal LEFT
ureter at the superior endplate of L3 with questionable
visualization of a second proximal ureteral calculus slightly more
proximally.

No RIGHT-sided calculi.

Bowel gas pattern normal.

Degenerative disc disease changes of the lumbar spine with L4-L5
fusion.
IMPRESSION: Two probable distal LEFT ureteral calculi measuring 7 x 5 mm and 4 x
3 mm.

## 2019-12-17 ENCOUNTER — Ambulatory Visit (INDEPENDENT_AMBULATORY_CARE_PROVIDER_SITE_OTHER): Payer: Medicare Other | Admitting: Cardiology

## 2019-12-17 ENCOUNTER — Encounter: Payer: Self-pay | Admitting: Cardiology

## 2019-12-17 ENCOUNTER — Other Ambulatory Visit: Payer: Self-pay

## 2019-12-17 VITALS — BP 140/86 | HR 85 | Ht 70.0 in | Wt 198.0 lb

## 2019-12-17 DIAGNOSIS — R0789 Other chest pain: Secondary | ICD-10-CM | POA: Diagnosis not present

## 2019-12-17 DIAGNOSIS — M545 Low back pain: Secondary | ICD-10-CM | POA: Diagnosis not present

## 2019-12-17 DIAGNOSIS — R55 Syncope and collapse: Secondary | ICD-10-CM | POA: Diagnosis not present

## 2019-12-17 DIAGNOSIS — G894 Chronic pain syndrome: Secondary | ICD-10-CM | POA: Diagnosis not present

## 2019-12-17 DIAGNOSIS — I82403 Acute embolism and thrombosis of unspecified deep veins of lower extremity, bilateral: Secondary | ICD-10-CM | POA: Diagnosis not present

## 2019-12-17 DIAGNOSIS — R42 Dizziness and giddiness: Secondary | ICD-10-CM

## 2019-12-17 DIAGNOSIS — R072 Precordial pain: Secondary | ICD-10-CM

## 2019-12-17 DIAGNOSIS — M542 Cervicalgia: Secondary | ICD-10-CM | POA: Diagnosis not present

## 2019-12-17 DIAGNOSIS — G8929 Other chronic pain: Secondary | ICD-10-CM | POA: Diagnosis not present

## 2019-12-17 DIAGNOSIS — R931 Abnormal findings on diagnostic imaging of heart and coronary circulation: Secondary | ICD-10-CM | POA: Diagnosis not present

## 2019-12-17 MED ORDER — METOPROLOL TARTRATE 100 MG PO TABS: ORAL_TABLET | ORAL | 0 refills | Status: DC

## 2019-12-17 NOTE — Progress Notes (Signed)
Cardiology Consult Note   Date:  12/17/2019   ID:  Craig [name], DOB [birth year], MRN [id number]  PCP:  London Pepper, MD  Cardiologist:  Fransico Him, MD   Chief Complaint  Patient presents with   New Patient (Initial Visit)    chest pain, dizziness    History of Present Illness:  Craig [name] is a 69 y.o. male who is being seen today for the evaluation of presyncope at the request of London Pepper, MD.  This is a 69yo male with a hx of DVT, HLD, lung nodules and OSA on CPAP.  He recently has been having dizziness and presyncope and is now referred for evaluation.  He tells me that he has not been feeling right since 02/2018 and over the past 3 months has been feeling dizzy and lightheaded and noticed that one day after coming home from the gym that his BP was low.  He gets very dizzy when he stand up too quickly and sometimes when walking.  Orthostatic Bp in his PCP office was normal lying and sitting but when standing dropped [id number] systolic with no real change in HR.  He is not on any BB.  He has been on Flomax when for BPH which he says does not really do much for him.  He was encouraged to increase his fluid intake.  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   He is here today for followup and is doing well.  His dizzy spells have significantly improved.  He tells me that he tends to be on the dehydrated side at baseline and urine is usually concentrated but after pushing fluids his urine is much lighter.  He has not really had any further dizziness.  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    He denies any SOB, DOE, PND, orthopnea, LE edema, palpitations or syncope. 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  He also has heartburn.  His CP has been nonexertional and he is able to walk for exercise with no problems.  He has never smoked but has a fm hx of CAD although at a late age in life (mom with MI in 35's and MGF with MI late age).  He is compliant with his meds and is tolerating meds with no SE.    Past Medical History:  Diagnosis Date   Back pain    BPH (benign prostatic hyperplasia)    Cervical radicular pain    DVT of lower extremity (deep venous thrombosis) (HCC)    Elevated glucose    Family history of adverse reaction to anesthesia    daughter gets nauseated   History of kidney stones    Hyperlipemia    Multiple lung nodules on CT    Prostatitis    Sleep apnea    wears CPAP    Past Surgical History:  Procedure Laterality Date   BACK SURGERY     CERVICAL SPINE SURGERY     EXTRACORPOREAL SHOCK WAVE LITHOTRIPSY Left 12/31/2018   Procedure: EXTRACORPOREAL SHOCK WAVE LITHOTRIPSY (ESWL);  Surgeon: [name], MD;  Location: [facility];  Service: Urology;  Laterality: Left;  IR THORACENTESIS ASP PLEURAL SPACE W/IMG GUIDE  04/23/2018   urinary stent Left     Current Medications: Current Meds  Medication Sig   atorvastatin (LIPITOR) 10 MG tablet Take 10 mg by mouth daily.   diphenhydrAMINE (BENADRYL) 25 MG tablet Take 25 mg by mouth every 6 (six) hours as needed.   HYDROcodone-acetaminophen (NORCO/VICODIN) 5-325 MG tablet Take 1 tablet by mouth every 4 (four) hours as needed for moderate pain.   loratadine (CLARITIN) 10 MG tablet Take 10 mg by mouth daily.   rivaroxaban (XARELTO) 20 MG TABS tablet Take 1 tablet (20 mg total) by mouth at bedtime.   tamsulosin (FLOMAX) 0.4 MG CAPS capsule Take 0.8 mg by mouth daily.   traMADol (ULTRAM) 50 MG tablet Take 50 mg by mouth 3 (three) times daily as needed (pain).    Allergies:   Gentamycin [gentamicin], Nsaids, and Vancomycin   Social History   Socioeconomic History   Marital status: Married     Spouse name: Not on file   Number of children: Not on file   Years of education: Not on file   Highest education level: Not on file  Occupational History   Not on file  Tobacco Use   Smoking status: Never Smoker   Smokeless tobacco: Never Used  Substance and Sexual Activity   Alcohol use: Yes    Comment: occa   Drug use: Never   Sexual activity: Not on file  Other Topics Concern   Not on file  Social History Narrative   Not on file   Social Determinants of Health   Financial Resource Strain:    Difficulty of Paying Living Expenses: Not on file  Food Insecurity:    Worried About [place] in the Last Year: Not on file   Ran Out of Food in the Last Year: Not on file  Transportation Needs:    Lack of Transportation (Medical): Not on file   Lack of Transportation (Non-Medical): Not on file  Physical Activity:    Days of Exercise per Week: Not on file   Minutes of Exercise per Session: Not on file  Stress:    Feeling of Stress : Not on file  Social Connections:    Frequency of Communication with Friends and Family: Not on file   Frequency of Social Gatherings with Friends and Family: Not on file   Attends Religious Services: Not on file   Active Member of Clubs or Organizations: Not on file   Attends Archivist Meetings: Not on file   Marital Status: Not on file     Family History:  The patient's family history includes Colon cancer in his mother.   ROS:   Please see the history of present illness.    ROS All other systems reviewed and are negative.  No flowsheet data found.   PHYSICAL EXAM:   VS:  BP 140/86    Pulse 85    Ht _0  (1.778 m)    Wt 198 lb (89.8 kg)    BMI 28.41 kg/m    GEN: Well nourished, well developed, in no acute distress  HEENT: normal  Neck: no JVD, carotid bruits, or masses Cardiac: RRR; no murmurs, rubs, or gallops,no edema.  Intact distal pulses bilaterally.  Respiratory:  clear to  auscultation bilaterally, normal work of breathing GI: soft, nontender, nondistended, + BS MS: no deformity or atrophy  Skin: warm and dry, no rash Neuro:  Alert and Oriented x 3, Strength and  sensation are intact Psych: euthymic mood, full affect  Wt Readings from Last 3 Encounters:  12/17/19 198 lb (89.8 kg)  12/31/18 200 lb (90.7 kg)  06/05/18 194 lb (88 kg)      Studies/Labs Reviewed:   EKG:  EKG is ordered today and showed NSR with no ST changes  Recent Labs: No results found for requested labs within last 8760 hours.   Lipid Panel No results found for: CHOL, TRIG, HDL, CHOLHDL, VLDL, LDLCALC, LDLDIRECT  Additional studies/ records that were reviewed today include:  OV notes from PCP    ASSESSMENT:    1. Postural dizziness with presyncope   2. Acute deep vein thrombosis (DVT) of both lower extremities, unspecified vein (HCC)   3. Precordial pain      PLAN:  In order of problems listed above:  1.  Postural dizziness with presyncope -his orthostatics at PCP office dropped [id number] systolic going from sitting to standing -since increasing fluids he has noticed that his urine no longer seems concentrated so he thinks that he was dehydrated -his dizziness has also improved -he also has b/l vestibular dysfunction from remote antibx which could contribute to his woosiness -he has never felt like he was going to pass out -orthostatics today are normal  -encouraged to continue with hydration and let me know if dizziness reoccurs -I will get a 2D echo as well to assess for structural heart disease  2.  Hx of multiple unprovoked DVTs -apparently had a workup for hypercoagulability in [place] that was unremarkable -he is on Xarelto for life  3.  Chest pain -this is atypical and nonexertional -he dose have a fm hx of CAD and HLD so I will get a coronary CTA     Medication Adjustments/Labs and Tests Ordered: Current medicines are reviewed at length with the patient  today.  Concerns regarding medicines are outlined above.  Medication changes, Labs and Tests ordered today are listed in the Patient Instructions below.  Patient Instructions  Medication Instructions:  Your physician recommends that you continue on your current medications as directed. Please refer to the Current Medication list given to you today.  *If you need a refill on your cardiac medications before your next appointment, please call your pharmacy*   Testing/Procedures: Your provider has recommended that you have a Coronary CTA scan. Please see below for further instructions.   Follow-Up: At [facility], you and your health needs are our priority.  As part of our continuing mission to provide you with exceptional heart care, we have created designated Provider Care Teams.  These Care Teams include your primary Cardiologist (physician) and Advanced Practice Providers (APPs -  Physician Assistants and Nurse Practitioners) who all work together to provide you with the care you need, when you need it.  Follow up with Dr. Radford Pax as needed based on results of testing.   Other Instructions Your cardiac CT will be scheduled at:   [facility] [address] [place], [place] [address] [phone]  Please arrive at the [facility] main entrance of [facility] 30 minutes prior to test start time. Proceed to the [facility] Radiology Department (first floor) to check-in and test prep.  Please follow these instructions carefully (unless otherwise directed):  Hold all erectile dysfunction medications at least 3 days (72 hrs) prior to test.  On the Night Before the Test:  Be sure to Drink plenty of water.  Do not consume any caffeinated/decaffeinated beverages or chocolate 12  hours prior to your test.  Do not take any antihistamines 12 hours prior to your test.  On the Day of the Test:  Drink plenty of water. Do not drink any water within one hour of the  test.  Do not eat any food 4 hours prior to the test.  You may take your regular medications prior to the test.   Take metoprolol (Lopressor) two hours prior to test.   After the Test:  Drink plenty of water.  After receiving IV contrast, you may experience a mild flushed feeling. This is normal.  On occasion, you may experience a mild rash up to 24 hours after the test. This is not dangerous. If this occurs, you can take Benadryl 25 mg and increase your fluid intake.  If you experience trouble breathing, this can be serious. If it is severe call 911 IMMEDIATELY. If it is mild, please call our office.  If you take any of these medications: Glipizide/Metformin, Avandament, Glucavance, please do not take 48 hours after completing test unless otherwise instructed.   Once we have confirmed authorization from your insurance company, we will call you to set up a date and time for your test. Based on how quickly your insurance processes prior authorizations requests, please allow up to 4 weeks to be contacted for scheduling your Cardiac CT appointment. Be advised that routine Cardiac CT appointments could be scheduled as many as 8 weeks after your provider has ordered it.  For non-scheduling related questions, please contact the cardiac imaging nurse navigator should you have any questions/concerns: Marchia Bond, Cardiac Imaging Nurse Navigator Burley Saver, Interim Cardiac Imaging Nurse [place] [facility] Services Direct Office Dial: [phone]   For scheduling needs, including cancellations and rescheduling, please call Vivien Rota at [phone], option 3.       Signed, Fransico Him, MD  12/17/2019 1:13 PM    [facility] [place], [place], [place]  [address] Phone: [phone]; Fax: [phone]

## 2019-12-17 NOTE — Patient Instructions (Addendum)
Medication Instructions:  Your physician recommends that you continue on your current medications as directed. Please refer to the Current Medication list given to you today.  *If you need a refill on your cardiac medications before your next appointment, please call your pharmacy*   Testing/Procedures: Your provider has recommended that you have a Coronary CTA scan. Please see below for further instructions.   Follow-Up: At [facility], you and your health needs are our priority.  As part of our continuing mission to provide you with exceptional heart care, we have created designated Provider Care Teams.  These Care Teams include your primary Cardiologist (physician) and Advanced Practice Providers (APPs -  Physician Assistants and Nurse Practitioners) who all work together to provide you with the care you need, when you need it.  Follow up with Dr. Radford Pax as needed based on results of testing.   Other Instructions Your cardiac CT will be scheduled at:   [facility] [address] [place], [place] [address] [phone]  Please arrive at the [facility] main entrance of [facility] 30 minutes prior to test start time. Proceed to the [facility] Radiology Department (first floor) to check-in and test prep.  Please follow these instructions carefully (unless otherwise directed):  Hold all erectile dysfunction medications at least 3 days (72 hrs) prior to test.  On the Night Before the Test: . Be sure to Drink plenty of water. . Do not consume any caffeinated/decaffeinated beverages or chocolate 12 hours prior to your test. . Do not take any antihistamines 12 hours prior to your test.  On the Day of the Test: . Drink plenty of water. Do not drink any water within one hour of the test. . Do not eat any food 4 hours prior to the test. . You may take your regular medications prior to the test.  . Take metoprolol (Lopressor) two hours prior to test.   After  the Test: . Drink plenty of water. . After receiving IV contrast, you may experience a mild flushed feeling. This is normal. . On occasion, you may experience a mild rash up to 24 hours after the test. This is not dangerous. If this occurs, you can take Benadryl 25 mg and increase your fluid intake. . If you experience trouble breathing, this can be serious. If it is severe call 911 IMMEDIATELY. If it is mild, please call our office. . If you take any of these medications: Glipizide/Metformin, Avandament, Glucavance, please do not take 48 hours after completing test unless otherwise instructed.   Once we have confirmed authorization from your insurance company, we will call you to set up a date and time for your test. Based on how quickly your insurance processes prior authorizations requests, please allow up to 4 weeks to be contacted for scheduling your Cardiac CT appointment. Be advised that routine Cardiac CT appointments could be scheduled as many as 8 weeks after your provider has ordered it.  For non-scheduling related questions, please contact the cardiac imaging nurse navigator should you have any questions/concerns: Marchia Bond, Cardiac Imaging Nurse Navigator Burley Saver, Interim Cardiac Imaging Nurse [place] and Vascular Services Direct Office Dial: [phone]   For scheduling needs, including cancellations and rescheduling, please call Vivien Rota at [phone], option 3.

## 2019-12-19 DIAGNOSIS — Z23 Encounter for immunization: Secondary | ICD-10-CM | POA: Diagnosis not present

## 2019-12-21 DIAGNOSIS — Z23 Encounter for immunization: Secondary | ICD-10-CM | POA: Diagnosis not present

## 2019-12-23 ENCOUNTER — Telehealth: Payer: Self-pay

## 2019-12-23 DIAGNOSIS — M542 Cervicalgia: Secondary | ICD-10-CM | POA: Diagnosis not present

## 2019-12-23 DIAGNOSIS — M5412 Radiculopathy, cervical region: Secondary | ICD-10-CM | POA: Diagnosis not present

## 2019-12-23 NOTE — Telephone Encounter (Signed)
Left message for patient to call back - needs to schedule lab work prior to CT scan which is being done 10/04.

## 2019-12-23 NOTE — Telephone Encounter (Signed)
Spoke with the patient and he will come by the office 9/28 for lab work.

## 2019-12-23 NOTE — Telephone Encounter (Signed)
-----   Message from Roosvelt Maser sent at 12/20/2019  3:28 PM EDT ----- Regarding: ct heart   Patient scheduled 10/4 @ 7:45am.  He needs labs  Thanks, Vivien Rota

## 2019-12-24 ENCOUNTER — Other Ambulatory Visit: Payer: Self-pay

## 2019-12-24 ENCOUNTER — Other Ambulatory Visit: Payer: Self-pay | Admitting: Orthopedic Surgery

## 2019-12-24 ENCOUNTER — Other Ambulatory Visit: Payer: Medicare Other

## 2019-12-24 DIAGNOSIS — R0789 Other chest pain: Secondary | ICD-10-CM | POA: Diagnosis not present

## 2019-12-24 DIAGNOSIS — M542 Cervicalgia: Secondary | ICD-10-CM

## 2019-12-24 DIAGNOSIS — R072 Precordial pain: Secondary | ICD-10-CM

## 2019-12-24 LAB — BASIC METABOLIC PANEL
BUN/Creatinine Ratio: 17 (ref 10–24)
BUN: 16 mg/dL (ref 8–27)
CO2: 23 mmol/L (ref 20–29)
Calcium: 9.5 mg/dL (ref 8.6–10.2)
Chloride: 103 mmol/L (ref 96–106)
Creatinine, Ser: 0.95 mg/dL (ref 0.76–1.27)
GFR calc Af Amer: 94 mL/min/{1.73_m2} (ref >59)
GFR calc non Af Amer: 81 mL/min/{1.73_m2} (ref >59)
Glucose: 92 mg/dL (ref 65–99)
Potassium: 4.3 mmol/L (ref 3.5–5.2)
Sodium: 139 mmol/L (ref 134–144)

## 2019-12-27 ENCOUNTER — Telehealth (HOSPITAL_COMMUNITY): Payer: Self-pay | Admitting: Emergency Medicine

## 2019-12-27 NOTE — Telephone Encounter (Signed)
Reaching out to patient to offer assistance regarding upcoming cardiac imaging study; pt verbalizes understanding of appt date/time, parking situation and where to check in, pre-test NPO status and medications ordered, and verified current allergies; name and call back number provided for further questions should they arise Craig Bond RN Navigator Cardiac Imaging [place] [facility] [phone] office [phone] cell   Pt asked to hold benadryl, claritin, ED medications; pt taking metoprolol at 5:45

## 2019-12-30 ENCOUNTER — Ambulatory Visit (HOSPITAL_COMMUNITY)
Admission: RE | Admit: 2019-12-30 | Discharge: 2019-12-30 | Disposition: A | Discharge status: Home / Self Care | Payer: Medicare Other | Source: Ambulatory Visit | Attending: Cardiology | Admitting: Cardiology

## 2019-12-30 ENCOUNTER — Other Ambulatory Visit: Payer: Self-pay

## 2019-12-30 DIAGNOSIS — R931 Abnormal findings on diagnostic imaging of heart and coronary circulation: Secondary | ICD-10-CM

## 2019-12-30 DIAGNOSIS — R072 Precordial pain: Secondary | ICD-10-CM

## 2019-12-30 DIAGNOSIS — R0789 Other chest pain: Secondary | ICD-10-CM | POA: Diagnosis not present

## 2019-12-30 MED ORDER — IOHEXOL 350 MG/ML SOLN
80.0000 mL | Freq: Once | INTRAVENOUS | Status: AC | PRN
  Administered 2019-12-30: 80 mL via INTRAVENOUS

## 2019-12-30 MED ORDER — NITROGLYCERIN 0.4 MG SL SUBL
SUBLINGUAL_TABLET | SUBLINGUAL | Status: AC
  Filled 2019-12-30: qty 2

## 2019-12-30 MED ORDER — NITROGLYCERIN 0.4 MG SL SUBL
0.8000 mg | SUBLINGUAL_TABLET | Freq: Once | SUBLINGUAL | Status: AC
  Administered 2019-12-30: 0.8 mg via SUBLINGUAL

## 2020-01-02 ENCOUNTER — Ambulatory Visit (HOSPITAL_COMMUNITY): Payer: Medicare Other | Attending: Internal Medicine

## 2020-01-02 ENCOUNTER — Other Ambulatory Visit: Payer: Self-pay

## 2020-01-02 DIAGNOSIS — R42 Dizziness and giddiness: Secondary | ICD-10-CM | POA: Diagnosis not present

## 2020-01-02 DIAGNOSIS — R55 Syncope and collapse: Secondary | ICD-10-CM | POA: Insufficient documentation

## 2020-01-02 LAB — ECHOCARDIOGRAM COMPLETE
Area-P 1/2: 2.56 cm2
S' Lateral: 2.5 cm

## 2020-01-14 DIAGNOSIS — R072 Precordial pain: Secondary | ICD-10-CM | POA: Diagnosis not present

## 2020-01-14 DIAGNOSIS — Z6828 Body mass index (BMI) 28.0-28.9, adult: Secondary | ICD-10-CM | POA: Diagnosis not present

## 2020-01-14 DIAGNOSIS — R931 Abnormal findings on diagnostic imaging of heart and coronary circulation: Secondary | ICD-10-CM | POA: Diagnosis not present

## 2020-01-16 ENCOUNTER — Other Ambulatory Visit: Payer: Self-pay

## 2020-01-16 ENCOUNTER — Ambulatory Visit
Admission: RE | Admit: 2020-01-16 | Discharge: 2020-01-16 | Disposition: A | Discharge status: Home / Self Care | Payer: Medicare Other | Source: Ambulatory Visit | Attending: Orthopedic Surgery | Admitting: Orthopedic Surgery

## 2020-01-16 DIAGNOSIS — M50221 Other cervical disc displacement at C4-C5 level: Secondary | ICD-10-CM | POA: Diagnosis not present

## 2020-01-16 DIAGNOSIS — M25512 Pain in left shoulder: Secondary | ICD-10-CM | POA: Diagnosis not present

## 2020-01-16 DIAGNOSIS — M542 Cervicalgia: Secondary | ICD-10-CM

## 2020-01-16 DIAGNOSIS — M47812 Spondylosis without myelopathy or radiculopathy, cervical region: Secondary | ICD-10-CM | POA: Diagnosis not present

## 2020-01-16 DIAGNOSIS — D1809 Hemangioma of other sites: Secondary | ICD-10-CM | POA: Diagnosis not present

## 2020-01-23 DIAGNOSIS — G8929 Other chronic pain: Secondary | ICD-10-CM | POA: Diagnosis not present

## 2020-01-23 DIAGNOSIS — M542 Cervicalgia: Secondary | ICD-10-CM | POA: Diagnosis not present

## 2020-01-23 DIAGNOSIS — M545 Low back pain, unspecified: Secondary | ICD-10-CM | POA: Diagnosis not present

## 2020-01-23 DIAGNOSIS — G894 Chronic pain syndrome: Secondary | ICD-10-CM | POA: Diagnosis not present

## 2020-02-11 DIAGNOSIS — Z86718 Personal history of other venous thrombosis and embolism: Secondary | ICD-10-CM | POA: Diagnosis not present

## 2020-02-11 DIAGNOSIS — R7309 Other abnormal glucose: Secondary | ICD-10-CM | POA: Diagnosis not present

## 2020-02-11 DIAGNOSIS — Z0000 Encounter for general adult medical examination without abnormal findings: Secondary | ICD-10-CM | POA: Diagnosis not present

## 2020-02-11 DIAGNOSIS — Z125 Encounter for screening for malignant neoplasm of prostate: Secondary | ICD-10-CM | POA: Diagnosis not present

## 2020-02-11 DIAGNOSIS — E785 Hyperlipidemia, unspecified: Secondary | ICD-10-CM | POA: Diagnosis not present

## 2020-02-14 DIAGNOSIS — Z1211 Encounter for screening for malignant neoplasm of colon: Secondary | ICD-10-CM | POA: Diagnosis not present

## 2020-02-14 DIAGNOSIS — I700 Atherosclerosis of aorta: Secondary | ICD-10-CM | POA: Diagnosis not present

## 2020-02-14 DIAGNOSIS — Z86718 Personal history of other venous thrombosis and embolism: Secondary | ICD-10-CM | POA: Diagnosis not present

## 2020-02-14 DIAGNOSIS — E785 Hyperlipidemia, unspecified: Secondary | ICD-10-CM | POA: Diagnosis not present

## 2020-02-14 DIAGNOSIS — Z125 Encounter for screening for malignant neoplasm of prostate: Secondary | ICD-10-CM | POA: Diagnosis not present

## 2020-02-14 DIAGNOSIS — N400 Enlarged prostate without lower urinary tract symptoms: Secondary | ICD-10-CM | POA: Diagnosis not present

## 2020-02-14 DIAGNOSIS — K429 Umbilical hernia without obstruction or gangrene: Secondary | ICD-10-CM | POA: Diagnosis not present

## 2020-02-14 DIAGNOSIS — Z0000 Encounter for general adult medical examination without abnormal findings: Secondary | ICD-10-CM | POA: Diagnosis not present

## 2020-02-17 DIAGNOSIS — M542 Cervicalgia: Secondary | ICD-10-CM | POA: Diagnosis not present

## 2020-02-25 ENCOUNTER — Other Ambulatory Visit: Payer: Self-pay | Admitting: Orthopedic Surgery

## 2020-02-25 DIAGNOSIS — M542 Cervicalgia: Secondary | ICD-10-CM

## 2020-03-04 ENCOUNTER — Other Ambulatory Visit: Payer: Self-pay

## 2020-03-04 ENCOUNTER — Ambulatory Visit
Admission: RE | Admit: 2020-03-04 | Discharge: 2020-03-04 | Disposition: A | Discharge status: Home / Self Care | Payer: Medicare Other | Source: Ambulatory Visit | Attending: Orthopedic Surgery | Admitting: Orthopedic Surgery

## 2020-03-04 DIAGNOSIS — M542 Cervicalgia: Secondary | ICD-10-CM

## 2020-03-16 DIAGNOSIS — M542 Cervicalgia: Secondary | ICD-10-CM | POA: Diagnosis not present

## 2020-03-16 DIAGNOSIS — G894 Chronic pain syndrome: Secondary | ICD-10-CM | POA: Diagnosis not present

## 2020-03-16 DIAGNOSIS — Z79899 Other long term (current) drug therapy: Secondary | ICD-10-CM | POA: Diagnosis not present

## 2020-03-16 DIAGNOSIS — G8929 Other chronic pain: Secondary | ICD-10-CM | POA: Diagnosis not present

## 2020-03-16 DIAGNOSIS — M545 Low back pain, unspecified: Secondary | ICD-10-CM | POA: Diagnosis not present

## 2020-04-28 DIAGNOSIS — L570 Actinic keratosis: Secondary | ICD-10-CM | POA: Diagnosis not present

## 2020-04-28 DIAGNOSIS — D225 Melanocytic nevi of trunk: Secondary | ICD-10-CM | POA: Diagnosis not present

## 2020-04-28 DIAGNOSIS — L720 Epidermal cyst: Secondary | ICD-10-CM | POA: Diagnosis not present

## 2020-04-28 DIAGNOSIS — L738 Other specified follicular disorders: Secondary | ICD-10-CM | POA: Diagnosis not present

## 2020-04-28 DIAGNOSIS — L814 Other melanin hyperpigmentation: Secondary | ICD-10-CM | POA: Diagnosis not present

## 2020-04-28 DIAGNOSIS — L821 Other seborrheic keratosis: Secondary | ICD-10-CM | POA: Diagnosis not present

## 2020-04-28 DIAGNOSIS — B351 Tinea unguium: Secondary | ICD-10-CM | POA: Diagnosis not present

## 2020-06-02 DIAGNOSIS — M542 Cervicalgia: Secondary | ICD-10-CM | POA: Diagnosis not present

## 2020-06-02 DIAGNOSIS — G8929 Other chronic pain: Secondary | ICD-10-CM | POA: Diagnosis not present

## 2020-06-02 DIAGNOSIS — M545 Low back pain, unspecified: Secondary | ICD-10-CM | POA: Diagnosis not present

## 2020-06-02 DIAGNOSIS — Z79899 Other long term (current) drug therapy: Secondary | ICD-10-CM | POA: Diagnosis not present

## 2020-06-02 DIAGNOSIS — G894 Chronic pain syndrome: Secondary | ICD-10-CM | POA: Diagnosis not present

## 2020-07-21 DIAGNOSIS — Z800 Family history of malignant neoplasm of digestive organs: Secondary | ICD-10-CM | POA: Diagnosis not present

## 2020-07-21 DIAGNOSIS — K5730 Diverticulosis of large intestine without perforation or abscess without bleeding: Secondary | ICD-10-CM | POA: Diagnosis not present

## 2020-07-21 DIAGNOSIS — Z86010 Personal history of colonic polyps: Secondary | ICD-10-CM | POA: Diagnosis not present

## 2020-08-03 DIAGNOSIS — R7309 Other abnormal glucose: Secondary | ICD-10-CM | POA: Diagnosis not present

## 2020-08-03 DIAGNOSIS — E785 Hyperlipidemia, unspecified: Secondary | ICD-10-CM | POA: Diagnosis not present

## 2020-08-03 DIAGNOSIS — Z86718 Personal history of other venous thrombosis and embolism: Secondary | ICD-10-CM | POA: Diagnosis not present

## 2020-08-03 DIAGNOSIS — H029 Unspecified disorder of eyelid: Secondary | ICD-10-CM | POA: Diagnosis not present

## 2020-08-03 DIAGNOSIS — N400 Enlarged prostate without lower urinary tract symptoms: Secondary | ICD-10-CM | POA: Diagnosis not present

## 2020-08-07 DIAGNOSIS — Z23 Encounter for immunization: Secondary | ICD-10-CM | POA: Diagnosis not present

## 2020-08-31 DIAGNOSIS — M47816 Spondylosis without myelopathy or radiculopathy, lumbar region: Secondary | ICD-10-CM | POA: Diagnosis not present

## 2020-08-31 DIAGNOSIS — M5440 Lumbago with sciatica, unspecified side: Secondary | ICD-10-CM | POA: Diagnosis not present

## 2020-09-01 DIAGNOSIS — L723 Sebaceous cyst: Secondary | ICD-10-CM | POA: Diagnosis not present

## 2020-09-01 DIAGNOSIS — L089 Local infection of the skin and subcutaneous tissue, unspecified: Secondary | ICD-10-CM | POA: Diagnosis not present

## 2020-09-02 ENCOUNTER — Other Ambulatory Visit: Payer: Self-pay | Admitting: Physical Medicine and Rehabilitation

## 2020-09-02 DIAGNOSIS — Z6828 Body mass index (BMI) 28.0-28.9, adult: Secondary | ICD-10-CM | POA: Diagnosis not present

## 2020-09-02 DIAGNOSIS — M542 Cervicalgia: Secondary | ICD-10-CM | POA: Diagnosis not present

## 2020-09-02 DIAGNOSIS — G894 Chronic pain syndrome: Secondary | ICD-10-CM | POA: Diagnosis not present

## 2020-09-02 DIAGNOSIS — G8929 Other chronic pain: Secondary | ICD-10-CM | POA: Diagnosis not present

## 2020-09-02 DIAGNOSIS — R030 Elevated blood-pressure reading, without diagnosis of hypertension: Secondary | ICD-10-CM | POA: Diagnosis not present

## 2020-09-02 DIAGNOSIS — Z79899 Other long term (current) drug therapy: Secondary | ICD-10-CM | POA: Diagnosis not present

## 2020-09-02 DIAGNOSIS — M545 Low back pain, unspecified: Secondary | ICD-10-CM | POA: Diagnosis not present

## 2020-09-02 DIAGNOSIS — M5440 Lumbago with sciatica, unspecified side: Secondary | ICD-10-CM

## 2020-09-06 ENCOUNTER — Ambulatory Visit
Admission: RE | Admit: 2020-09-06 | Discharge: 2020-09-06 | Disposition: A | Discharge status: Home / Self Care | Payer: Medicare Other | Source: Ambulatory Visit | Attending: Physical Medicine and Rehabilitation | Admitting: Physical Medicine and Rehabilitation

## 2020-09-06 DIAGNOSIS — M5124 Other intervertebral disc displacement, thoracic region: Secondary | ICD-10-CM | POA: Diagnosis not present

## 2020-09-06 DIAGNOSIS — D1800 Hemangioma unspecified site: Secondary | ICD-10-CM | POA: Diagnosis not present

## 2020-09-06 DIAGNOSIS — M4804 Spinal stenosis, thoracic region: Secondary | ICD-10-CM | POA: Diagnosis not present

## 2020-09-06 DIAGNOSIS — M47814 Spondylosis without myelopathy or radiculopathy, thoracic region: Secondary | ICD-10-CM | POA: Diagnosis not present

## 2020-09-06 DIAGNOSIS — M5440 Lumbago with sciatica, unspecified side: Secondary | ICD-10-CM

## 2020-09-07 DIAGNOSIS — M5124 Other intervertebral disc displacement, thoracic region: Secondary | ICD-10-CM | POA: Diagnosis not present

## 2020-09-08 DIAGNOSIS — D696 Thrombocytopenia, unspecified: Secondary | ICD-10-CM | POA: Diagnosis not present

## 2020-09-15 DIAGNOSIS — M47816 Spondylosis without myelopathy or radiculopathy, lumbar region: Secondary | ICD-10-CM | POA: Diagnosis not present

## 2020-10-01 DIAGNOSIS — Z6828 Body mass index (BMI) 28.0-28.9, adult: Secondary | ICD-10-CM | POA: Diagnosis not present

## 2020-10-01 DIAGNOSIS — G894 Chronic pain syndrome: Secondary | ICD-10-CM | POA: Diagnosis not present

## 2020-10-01 DIAGNOSIS — G8929 Other chronic pain: Secondary | ICD-10-CM | POA: Diagnosis not present

## 2020-10-01 DIAGNOSIS — R030 Elevated blood-pressure reading, without diagnosis of hypertension: Secondary | ICD-10-CM | POA: Diagnosis not present

## 2020-10-01 DIAGNOSIS — Z79899 Other long term (current) drug therapy: Secondary | ICD-10-CM | POA: Diagnosis not present

## 2020-10-01 DIAGNOSIS — M545 Low back pain, unspecified: Secondary | ICD-10-CM | POA: Diagnosis not present

## 2020-10-01 DIAGNOSIS — M542 Cervicalgia: Secondary | ICD-10-CM | POA: Diagnosis not present

## 2020-10-09 DIAGNOSIS — Z20822 Contact with and (suspected) exposure to covid-19: Secondary | ICD-10-CM | POA: Diagnosis not present

## 2020-12-30 DIAGNOSIS — Z23 Encounter for immunization: Secondary | ICD-10-CM | POA: Diagnosis not present

## 2021-01-04 DIAGNOSIS — Z23 Encounter for immunization: Secondary | ICD-10-CM | POA: Diagnosis not present

## 2021-02-04 DIAGNOSIS — N400 Enlarged prostate without lower urinary tract symptoms: Secondary | ICD-10-CM | POA: Diagnosis not present

## 2021-02-04 DIAGNOSIS — R7309 Other abnormal glucose: Secondary | ICD-10-CM | POA: Diagnosis not present

## 2021-02-04 DIAGNOSIS — Z86718 Personal history of other venous thrombosis and embolism: Secondary | ICD-10-CM | POA: Diagnosis not present

## 2021-02-04 DIAGNOSIS — D696 Thrombocytopenia, unspecified: Secondary | ICD-10-CM | POA: Diagnosis not present

## 2021-02-04 DIAGNOSIS — E785 Hyperlipidemia, unspecified: Secondary | ICD-10-CM | POA: Diagnosis not present

## 2021-02-04 DIAGNOSIS — I700 Atherosclerosis of aorta: Secondary | ICD-10-CM | POA: Diagnosis not present

## 2021-02-10 ENCOUNTER — Other Ambulatory Visit: Payer: Self-pay

## 2021-02-10 ENCOUNTER — Ambulatory Visit (INDEPENDENT_AMBULATORY_CARE_PROVIDER_SITE_OTHER): Payer: Medicare Other | Admitting: Cardiology

## 2021-02-10 ENCOUNTER — Encounter: Payer: Self-pay | Admitting: Cardiology

## 2021-02-10 VITALS — BP 124/70 | HR 88 | Ht 70.0 in | Wt 201.8 lb

## 2021-02-10 DIAGNOSIS — I2510 Atherosclerotic heart disease of native coronary artery without angina pectoris: Secondary | ICD-10-CM | POA: Diagnosis not present

## 2021-02-10 DIAGNOSIS — R55 Syncope and collapse: Secondary | ICD-10-CM

## 2021-02-10 DIAGNOSIS — R42 Dizziness and giddiness: Secondary | ICD-10-CM

## 2021-02-10 DIAGNOSIS — I2583 Coronary atherosclerosis due to lipid rich plaque: Secondary | ICD-10-CM

## 2021-02-10 DIAGNOSIS — I82403 Acute embolism and thrombosis of unspecified deep veins of lower extremity, bilateral: Secondary | ICD-10-CM

## 2021-02-10 DIAGNOSIS — E785 Hyperlipidemia, unspecified: Secondary | ICD-10-CM

## 2021-02-10 MED ORDER — ATORVASTATIN CALCIUM 10 MG PO TABS: 10.0000 mg | ORAL_TABLET | Freq: Every day | ORAL | 3 refills | Status: AC

## 2021-02-10 NOTE — Progress Notes (Signed)
Cardiology Consult Note   Date:  02/10/2021   ID:  Craig [name], DOB 01-29-1951, MRN [id number]  PCP:  London Pepper, MD  Cardiologist:  Fransico Him, MD   Chief Complaint  Patient presents with   Coronary Artery Disease     History of Present Illness:  Craig [name] is a 70 y.o. male with a hx of DVT, HLD, lung nodules, syncope and OSA on CPAP.  When I last saw him he was complaining of dizziness and presyncope.  orthostatic Bp in his PCP office was normal lying and sitting but when standing dropped 82XHBZ systolic with no real change in HR.  He is not on any BB.  He has been on Flomax when for BPH which he says does not really do much for him.  He was encouraged to increase his fluid intake.  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   He tends to be on the dehydrated side at baseline and urine is usually concentrated but after pushing fluids his urine is much lighter.  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    He was felt to have postural dizziness with presyncope related to dehydration and possibly a vestibular dysfunction as well.  2D echo showed normal LV function with EF 60 to 65% with mild LVH and grade 1 diastolic dysfunction.  Left atrium is mildly enlarged.  Coronary CTA showed mild CAD with no obstructive disease.  FFR was normal.  There was 25 to 49% diffuse RCA disease, 25 to 49% ostial LAD disease.  He is here today for followup and is doing well.  He denies any chest pain or pressure, SOB, DOE, PND, orthopnea, LE edema, dizziness, palpitations or syncope. He is compliant with his meds and is tolerating meds with no SE.      Past Medical History:  Diagnosis Date   Back pain    BPH (benign prostatic hyperplasia)    CAD (coronary artery disease) 2021   0 to 65% with mild LVH and grade 1 diastolic  dysfunction.  Left atrium is mildly enlarged.  Coronary CTA showed mild CAD with no obstructive disease with 25 to 49% diffuse RCA disease, 25 to 49% ostial LAD disease>>normal FFR   Cervical radicular pain    DVT of lower extremity (deep venous thrombosis) (HCC)    Elevated glucose    Family history of adverse reaction to anesthesia    daughter gets nauseated   History of kidney stones    Hyperlipemia    Multiple lung nodules on CT    Prostatitis    Sleep apnea    wears CPAP    Past Surgical History:  Procedure Laterality Date   BACK SURGERY     CERVICAL SPINE SURGERY     EXTRACORPOREAL SHOCK WAVE LITHOTRIPSY Left 12/31/2018   Procedure: EXTRACORPOREAL SHOCK WAVE LITHOTRIPSY (ESWL);  Surgeon: [name], MD;  Location: [facility];  Service: Urology;  Laterality: Left;   IR THORACENTESIS ASP PLEURAL SPACE W/IMG GUIDE  04/23/2018   urinary stent Left     Current Medications: Current Meds  Medication Sig   atorvastatin (LIPITOR) 10 MG tablet Take 10 mg by mouth daily.   diphenhydrAMINE (BENADRYL) 25 MG tablet Take 25 mg by mouth every 6 (six) hours as needed.  HYDROcodone-acetaminophen (NORCO/VICODIN) 5-325 MG tablet Take 1 tablet by mouth every 4 (four) hours as needed for moderate pain.   rivaroxaban (XARELTO) 20 MG TABS tablet Take 1 tablet (20 mg total) by mouth at bedtime.   tamsulosin (FLOMAX) 0.4 MG CAPS capsule Take 0.8 mg by mouth daily.   traMADol (ULTRAM) 50 MG tablet Take 50 mg by mouth 3 (three) times daily as needed (pain).    Allergies:   Gentamycin [gentamicin], Nsaids, and Vancomycin   Social History   Socioeconomic History   Marital status: Married    Spouse name: Not on file   Number of children: Not on file   Years of education: Not on file   Highest education level: Not on file  Occupational History   Not on file  Tobacco Use   Smoking status: Never   Smokeless tobacco: Never  Substance and Sexual Activity   Alcohol use: Yes    Comment: occa    Drug use: Never   Sexual activity: Not on file  Other Topics Concern   Not on file  Social History Narrative   Not on file   Social Determinants of Health   Financial Resource Strain: Not on file  Food Insecurity: Not on file  Transportation Needs: Not on file  Physical Activity: Not on file  Stress: Not on file  Social Connections: Not on file     Family History:  The patient's family history includes Colon cancer in his mother.   ROS:   Please see the history of present illness.    ROS All other systems reviewed and are negative.  No flowsheet data found.   PHYSICAL EXAM:   VS:  BP 124/70   Pulse 88   Ht 5\' 10"  (1.778 m)   Wt 201 lb 12.8 oz (91.5 kg)   SpO2 98%   BMI 28.96 kg/m    GEN: Well nourished, well developed in no acute distress HEENT: Normal NECK: No JVD; No carotid bruits LYMPHATICS: No lymphadenopathy CARDIAC:RRR, no murmurs, rubs, gallops RESPIRATORY:  Clear to auscultation without rales, wheezing or rhonchi  ABDOMEN: Soft, non-tender, non-distended MUSCULOSKELETAL:  No edema; No deformity  SKIN: Warm and dry NEUROLOGIC:  Alert and oriented x 3 PSYCHIATRIC:  Normal affect   Wt Readings from Last 3 Encounters:  02/10/21 201 lb 12.8 oz (91.5 kg)  12/17/19 198 lb (89.8 kg)  12/31/18 200 lb (90.7 kg)      Studies/Labs Reviewed:   EKG:  EKG is not ordered today  Recent Labs: No results found for requested labs within last 8760 hours.   Lipid Panel No results found for: CHOL, TRIG, HDL, CHOLHDL, VLDL, LDLCALC, LDLDIRECT  Additional studies/ records that were reviewed today include:  OV notes from PCP    ASSESSMENT:    1. Postural dizziness with presyncope   2. Acute deep vein thrombosis (DVT) of both lower extremities, unspecified vein (HCC)   3. Precordial pain      PLAN:  In order of problems listed above:  1.  Postural dizziness with presyncope -his orthostatics at PCP office dropped 32GMWN systolic going from sitting to  standing -since increasing fluids he has noticed that his urine no longer seems concentrated so he thinks that he was dehydrated -he also has b/l vestibular dysfunction from remote antibx which could contribute to his woosiness -Echo 2021 was normal  2.  Hx of multiple unprovoked DVTs -apparently had a workup for hypercoagulability in [place] that was unremarkable -he is on Xarelto  for life  3.  ASCAD - Coronary CTA showed mild CAD with no obstructive disease.  FFR was normal.  There was 25 to 49% diffuse RCA disease, 25 to 49% ostial LAD disease. -he has not had any further chest pain -Continue prescription drug management with atorvastatin 10 mg daily with as needed refills -Aspirin due to [place] for DVTs  4.  HLD -LDL goal is less than 70. - have personally reviewed and interpreted outside labs performed by patient's PCP which showed LDL 60, triglycerides 98, HDL 47, ALT 15 on 02/04/2021 -Drug management with atorvastatin 10 mg daily with as needed refills    Medication Adjustments/Labs and Tests Ordered: Current medicines are reviewed at length with the patient today.  Concerns regarding medicines are outlined above.  Medication changes, Labs and Tests ordered today are listed in the Patient Instructions below.  There are no Patient Instructions on file for this visit.    Signed, Fransico Him, MD  02/10/2021 9:34 AM    [address], [place],   [address] Phone: [phone]; Fax: [phone]

## 2021-02-10 NOTE — Addendum Note (Signed)
Addended by: Antonieta Iba on: 02/10/2021 09:37 AM   Modules accepted: Orders

## 2021-02-10 NOTE — Patient Instructions (Signed)

## 2021-03-04 DIAGNOSIS — Z20822 Contact with and (suspected) exposure to covid-19: Secondary | ICD-10-CM | POA: Diagnosis not present

## 2021-04-14 DIAGNOSIS — M542 Cervicalgia: Secondary | ICD-10-CM | POA: Diagnosis not present

## 2021-04-14 DIAGNOSIS — M545 Low back pain, unspecified: Secondary | ICD-10-CM | POA: Diagnosis not present

## 2021-04-14 DIAGNOSIS — Z79899 Other long term (current) drug therapy: Secondary | ICD-10-CM | POA: Diagnosis not present

## 2021-04-14 DIAGNOSIS — G894 Chronic pain syndrome: Secondary | ICD-10-CM | POA: Diagnosis not present

## 2021-04-14 DIAGNOSIS — Z6829 Body mass index (BMI) 29.0-29.9, adult: Secondary | ICD-10-CM | POA: Diagnosis not present

## 2021-04-14 DIAGNOSIS — G8929 Other chronic pain: Secondary | ICD-10-CM | POA: Diagnosis not present

## 2021-04-14 DIAGNOSIS — R030 Elevated blood-pressure reading, without diagnosis of hypertension: Secondary | ICD-10-CM | POA: Diagnosis not present

## 2021-04-16 DIAGNOSIS — M25511 Pain in right shoulder: Secondary | ICD-10-CM | POA: Diagnosis not present

## 2021-05-18 DIAGNOSIS — M47816 Spondylosis without myelopathy or radiculopathy, lumbar region: Secondary | ICD-10-CM | POA: Diagnosis not present

## 2021-05-25 DIAGNOSIS — Z20822 Contact with and (suspected) exposure to covid-19: Secondary | ICD-10-CM | POA: Diagnosis not present

## 2021-05-26 DIAGNOSIS — M67911 Unspecified disorder of synovium and tendon, right shoulder: Secondary | ICD-10-CM | POA: Diagnosis not present

## 2021-05-27 ENCOUNTER — Other Ambulatory Visit: Payer: Self-pay | Admitting: Orthopedic Surgery

## 2021-05-27 DIAGNOSIS — M25511 Pain in right shoulder: Secondary | ICD-10-CM

## 2021-06-10 ENCOUNTER — Ambulatory Visit
Admission: RE | Admit: 2021-06-10 | Discharge: 2021-06-10 | Disposition: A | Discharge status: Home / Self Care | Payer: Medicare Other | Source: Ambulatory Visit | Attending: Orthopedic Surgery | Admitting: Orthopedic Surgery

## 2021-06-10 ENCOUNTER — Other Ambulatory Visit: Payer: Self-pay

## 2021-06-10 DIAGNOSIS — M25511 Pain in right shoulder: Secondary | ICD-10-CM | POA: Diagnosis not present

## 2021-06-12 DIAGNOSIS — Z20822 Contact with and (suspected) exposure to covid-19: Secondary | ICD-10-CM | POA: Diagnosis not present

## 2021-06-23 DIAGNOSIS — M75111 Incomplete rotator cuff tear or rupture of right shoulder, not specified as traumatic: Secondary | ICD-10-CM | POA: Diagnosis not present

## 2021-07-08 DIAGNOSIS — L03012 Cellulitis of left finger: Secondary | ICD-10-CM | POA: Diagnosis not present

## 2021-07-12 DIAGNOSIS — M25511 Pain in right shoulder: Secondary | ICD-10-CM | POA: Diagnosis not present

## 2021-07-12 DIAGNOSIS — Z20822 Contact with and (suspected) exposure to covid-19: Secondary | ICD-10-CM | POA: Diagnosis not present

## 2021-07-12 DIAGNOSIS — N400 Enlarged prostate without lower urinary tract symptoms: Secondary | ICD-10-CM | POA: Diagnosis not present

## 2021-07-12 DIAGNOSIS — R7309 Other abnormal glucose: Secondary | ICD-10-CM | POA: Diagnosis not present

## 2021-07-12 DIAGNOSIS — H9190 Unspecified hearing loss, unspecified ear: Secondary | ICD-10-CM | POA: Diagnosis not present

## 2021-07-12 DIAGNOSIS — Z0000 Encounter for general adult medical examination without abnormal findings: Secondary | ICD-10-CM | POA: Diagnosis not present

## 2021-07-12 DIAGNOSIS — Z86718 Personal history of other venous thrombosis and embolism: Secondary | ICD-10-CM | POA: Diagnosis not present

## 2021-07-12 DIAGNOSIS — I700 Atherosclerosis of aorta: Secondary | ICD-10-CM | POA: Diagnosis not present

## 2021-07-12 DIAGNOSIS — E785 Hyperlipidemia, unspecified: Secondary | ICD-10-CM | POA: Diagnosis not present

## 2021-07-13 ENCOUNTER — Ambulatory Visit (INDEPENDENT_AMBULATORY_CARE_PROVIDER_SITE_OTHER): Payer: Medicare Other | Admitting: Physician Assistant

## 2021-07-13 ENCOUNTER — Telehealth: Payer: Self-pay | Admitting: *Deleted

## 2021-07-13 ENCOUNTER — Telehealth: Payer: Self-pay | Admitting: Cardiology

## 2021-07-13 DIAGNOSIS — Z01810 Encounter for preprocedural cardiovascular examination: Secondary | ICD-10-CM | POA: Diagnosis not present

## 2021-07-13 DIAGNOSIS — Z79899 Other long term (current) drug therapy: Secondary | ICD-10-CM | POA: Diagnosis not present

## 2021-07-13 DIAGNOSIS — R030 Elevated blood-pressure reading, without diagnosis of hypertension: Secondary | ICD-10-CM | POA: Diagnosis not present

## 2021-07-13 DIAGNOSIS — M545 Low back pain, unspecified: Secondary | ICD-10-CM | POA: Diagnosis not present

## 2021-07-13 DIAGNOSIS — G894 Chronic pain syndrome: Secondary | ICD-10-CM | POA: Diagnosis not present

## 2021-07-13 DIAGNOSIS — M542 Cervicalgia: Secondary | ICD-10-CM | POA: Diagnosis not present

## 2021-07-13 DIAGNOSIS — Z6829 Body mass index (BMI) 29.0-29.9, adult: Secondary | ICD-10-CM | POA: Diagnosis not present

## 2021-07-13 DIAGNOSIS — G8929 Other chronic pain: Secondary | ICD-10-CM | POA: Diagnosis not present

## 2021-07-13 NOTE — Telephone Encounter (Signed)
? ? ?  Name: Craig [name]  ?DOB: May 08, 1950  ?MRN: [id number] ? ?Primary Cardiologist: Fransico Him, MD - last [place] 11/202 ? ? ?Preoperative team, please contact this patient and set up a phone call appointment for further preoperative risk assessment. Please obtain consent and complete medication review. Thank you for your help. ? ?Regarding Xarelto, this medicine is for history of recurrent VTE and our office has never prescribed it for this patient therefore would suggest notifying surgical team to reach out to PCP for clearance to hold. ? ? ? ?Charlie Pitter, PA-C ?07/13/2021, 10:13 AM ?[facility] ?[address] ?[place], [place] [address] ? ? ?

## 2021-07-13 NOTE — Telephone Encounter (Signed)
Pt agreeable to plan of care for tele pre op appt today at 2 pm with Melina Copa, PAC. Med rec and consent are done.  ? ?  ?Patient Consent for Virtual Visit  ? ? ?   ? ?Craig [name] has provided verbal consent on 07/13/2021 for a virtual visit (video or telephone). ? ? ?CONSENT FOR VIRTUAL VISIT FOR:  Craig [name]  ?By participating in this virtual visit I agree to the following: ? ?I hereby voluntarily request, consent and authorize [facility] and its employed or contracted physicians, physician assistants, nurse practitioners or other licensed health care professionals (the Practitioner), to provide me with telemedicine health care services (the ?Services") as deemed necessary by the treating Practitioner. I acknowledge and consent to receive the Services by the Practitioner via telemedicine. I understand that the telemedicine visit will involve communicating with the Practitioner through live audiovisual communication technology and the disclosure of certain medical information by electronic transmission. I acknowledge that I have been given the opportunity to request an in-person assessment or other available alternative prior to the telemedicine visit and am voluntarily participating in the telemedicine visit. ? ?I understand that I have the right to withhold or withdraw my consent to the use of telemedicine in the course of my care at any time, without affecting my right to future care or treatment, and that the Practitioner or I may terminate the telemedicine visit at any time. I understand that I have the right to inspect all information obtained and/or recorded in the course of the telemedicine visit and may receive copies of available information for a reasonable fee.  I understand that some of the potential risks of receiving the Services via telemedicine include:  ?Delay or interruption in medical evaluation due to technological equipment failure or disruption; ?Information transmitted may not be sufficient  (e.g. poor resolution of images) to allow for appropriate medical decision making by the Practitioner; and/or  ?In rare instances, security protocols could fail, causing a breach of personal health information. ? ?Furthermore, I acknowledge that it is my responsibility to provide information about my medical history, conditions and care that is complete and accurate to the best of my ability. I acknowledge that Practitioner's advice, recommendations, and/or decision may be based on factors not within their control, such as incomplete or inaccurate data provided by me or distortions of diagnostic images or specimens that may result from electronic transmissions. I understand that the practice of medicine is not an exact science and that Practitioner makes no warranties or guarantees regarding treatment outcomes. I acknowledge that a copy of this consent can be made available to me via my patient portal ([facility]), or I can request a printed copy by calling the office of [facility].   ? ?I understand that my insurance will be billed for this visit.  ? ?I have read or had this consent read to me. ?I understand the contents of this consent, which adequately explains the benefits and risks of the Services being provided via telemedicine.  ?I have been provided ample opportunity to ask questions regarding this consent and the Services and have had my questions answered to my satisfaction. ?I give my informed consent for the services to be provided through the use of telemedicine in my medical care ? ? ? ?

## 2021-07-13 NOTE — Telephone Encounter (Signed)
? ?  [facility] Pre-operative Risk Assessment  ?  ?Request for surgical clearance: ? ?What type of surgery is being performed?  ?Right Shoulder Arthroscopy Rotator Cuff Repair ? ?When is this surgery scheduled?  ?08/03/21  ? ?What type of clearance is required (medical clearance vs. Pharmacy clearance to hold med vs. Both)?  ?Both  ? ?Are there any medications that need to be held prior to surgery and how long? ?Their office is requesting our recommendation regarding medications ? ?Practice name and name of physician performing surgery?  ?Dr. Tania Ade  ? ?What is your office phone number? ?[phone]  ?  ?7.   What is your office fax number ?[phone] ? ?8.   Anesthesia type (None, local, MAC, general) ?  ?Choice  ? ? ?Zara Council ?07/13/2021, 8:52 AM  ?

## 2021-07-13 NOTE — Telephone Encounter (Signed)
Pt agreeable to plan of care for tele pre op appt today at 2 pm with Melina Copa, PAC. Med rec and consent are done.  ?

## 2021-07-13 NOTE — Progress Notes (Signed)
? ?Virtual Visit via Telephone Note  ? ?This visit type was conducted due to national recommendations for restrictions regarding the COVID-19 Pandemic (e.g. social distancing) in an effort to limit this patient's exposure and mitigate transmission in our community.  Due to his co-morbid illnesses, this patient is at least at moderate risk for complications without adequate follow up.  This format is felt to be most appropriate for this patient at this time.  The patient did not have access to video technology/had technical difficulties with video requiring transitioning to audio format only (telephone).  All issues noted in this document were discussed and addressed.  No physical exam could be performed with this format.  Please refer to the patient's chart for his  consent to telehealth for [facility]. ? ?Evaluation Performed:  Preoperative cardiovascular risk assessment ?_____________  ? ?Date:  07/13/2021  ? ?Patient ID:  Craig [name], DOB 10-02-1950, MRN [id number] ?Patient Location:  ?Home ?Provider location:   ?Office ? ?Primary Care Provider:  London Pepper, MD ?Primary Cardiologist:  Craig Him, MD ? ?Chief Complaint  ?  ?71 y.o. y/o male with a h/o syncope, nonobstructive CAD by coronary CTA 12/2019, multiple unprovoked DVTs, HLD, lung nodules, OSA on CPAP, BPH, who is pending shoulder surgery, and presents today for telephonic preoperative cardiovascular risk assessment. ? ?Past Medical History  ?  ?Past Medical History:  ?Diagnosis Date  ? Back pain   ? BPH (benign prostatic hyperplasia)   ? CAD (coronary artery disease) 2021  ? Cervical radicular pain   ? DVT of lower extremity (deep venous thrombosis) ([facility])   ? Elevated glucose   ? Family history of adverse reaction to anesthesia   ? daughter gets nauseated  ? History of kidney stones   ? Hyperlipemia   ? Multiple lung nodules on CT   ? Prostatitis   ? Sleep apnea   ? wears CPAP  ? ?Past Surgical History:  ?Procedure Laterality Date  ? BACK SURGERY     ? CERVICAL SPINE SURGERY    ? EXTRACORPOREAL SHOCK WAVE LITHOTRIPSY Left 12/31/2018  ? Procedure: EXTRACORPOREAL SHOCK WAVE LITHOTRIPSY (ESWL);  Surgeon: [name], MD;  Location: [facility];  Service: Urology;  Laterality: Left;  ? IR THORACENTESIS ASP PLEURAL SPACE W/IMG GUIDE  04/23/2018  ? urinary stent Left   ? ? ?Allergies ? ?Allergies  ?Allergen Reactions  ? Gentamycin [Gentamicin] Other (See Comments)  ?  Damaged L inner ear  ? Nsaids   ?  Pt is on Xarelto  ? Vancomycin Rash  ?  Red man syndrome  ? ? ?History of Present Illness  ?  ?Craig [name] is a 71 y.o. male who presents via audio/video conferencing for a telehealth visit today. Coronary CTA from 2021 is outlined below. Last echo 12/2019 showed EF 60-65%, mild LVH of basal septal segment, g1DD, mild LAE. Pt was last seen in cardiology clinic on 02/10/21 by Dr. Radford [name].  At that time Craig [name] was doing overall doing well.  The patient is now pending right shoulder arthroscopy rotator cuff repair. Since his last visit, he has done very well and is able to exercise vigorously 3x/week without any recent cardiac symptoms to report. He also carries a history of multiple unprovoked DVTs and has been on Xarelto for life. He apparently had a workup for hypercoagulability in [place] that was unremarkable. His last clotting event was 4-5 years ago. His primary care manages his Xarelto. ? ?Home Medications  ?  ?Prior to Admission  medications   ?Medication Sig Start Date End Date Taking? Authorizing Provider  ?atorvastatin (LIPITOR) 10 MG tablet Take 1 tablet (10 mg total) by mouth daily. 02/10/21   Sueanne Margarita, MD  ?diphenhydrAMINE (BENADRYL) 25 MG tablet Take 25 mg by mouth every 6 (six) hours as needed.    [provider]  ?HYDROcodone-acetaminophen (NORCO/VICODIN) 5-325 MG tablet Take 1 tablet by mouth every 4 (four) hours as needed for moderate pain. 12/16/18   Daleen Bo, MD  ?loratadine (CLARITIN) 10 MG tablet Take 10 mg by mouth daily.     [provider]  ?metoprolol tartrate (LOPRESSOR) 100 MG tablet Take 1 tablet (100 mg total) two hours prior to CT scan. 12/17/19   Sueanne Margarita, MD  ?rivaroxaban (XARELTO) 20 MG TABS tablet Take 1 tablet (20 mg total) by mouth at bedtime. 01/01/19   [name], MD  ?tamsulosin (FLOMAX) 0.4 MG CAPS capsule Take 0.8 mg by mouth daily.    [provider]  ?TIZANIDINE HCL PO Take by mouth. PT NOT SURE OF DOSAGE AS ONLY PRESCRIBED TODAY    [provider]  ?traMADol (ULTRAM) 50 MG tablet Take 50 mg by mouth 3 (three) times daily as needed (pain).    [provider]  ? ? ?Physical Exam  ?  ?Vital Signs:  Craig [name] does not have vital signs available for review today. ?Yesterday @ PCP office per pt report: 104/62, pulse 70 ? ?Given telephonic nature of communication, physical exam is limited. ?AAOx3. NAD. Normal affect.  Speech and respirations are unlabored. ? ?Accessory Clinical Findings  ?  ?Cor CT 12/2019 ?FINDINGS: ?A 120 kV prospective scan was triggered in the descending thoracic ?aorta at 111 HU's. Axial non-contrast 3 mm slices were carried out ?through the heart. The data set was analyzed on a dedicated work ?station and scored using the [place]. Gantry rotation speed ?was 250 msecs and collimation was .6 mm. No beta blockade and 0.8 mg ?of sl NTG was given. The 3D data set was reconstructed in 5% ?intervals of the 67-82 % of the R-R cycle. Diastolic phases were ?analyzed on a dedicated work station using MPR, MIP and VRT modes. ?The patient received 80 cc of contrast. ?  ?Aorta: Normal size. Scattered calcifications in descending aorta. No ?dissection. ?  ?Aortic Valve:  Trileaflet.  No calcifications. ?  ?Coronary Arteries:  Normal coronary origin.  Right dominance. ?  ?RCA is a large dominant artery that gives rise to PDA and PLVB. ?There is mild calcified plaque in the proximal RCA with associated ?stenosis of 25-49%. There is mild non calcified plaque in  the mid ?RCA distal to the takeoff of an acute RV marginal branch with ?associated stenosis of 25-49%. There is mild mixed plaque in the ?distal RCA with associated stenosis of 25-49%. ?  ?Left main is a large artery that gives rise to LAD, Ramus and LCX ?arteries. There is no plaque. ?  ?LAD is a large vessel that gives rise to moderate sized D1 and D2 ?branches. There is mild calcified plaque in the ostial LAD with ?associated stenosis of 25-49%. There is minimal calcified plaque ?scattered in the proximal mid and distal LAD with associated ?stenosis of 0-24%. ?  ?The Ramus is large vessel that has no plaque. There is minimal ?calcified plaque in the proximal vessel with associated stenosis of ?0-24%. ?  ?LCX is a non-dominant artery that gives rise to one moderate sized ?OM1 branch. There is no plaque. ?  ?  Other findings: ?  ?Normal pulmonary vein drainage into the left atrium. ?  ?Normal let atrial appendage without a thrombus. ?  ?Normal size of the pulmonary artery. ?  ?IMPRESSION: ?1. Coronary calcium score of 158. This was 53rd percentile for age ?and sex matched control. ?  ?2.  Normal coronary origin with right dominance. ?  ?3.  Mild atherosclerosis of the LAD and RCA.  CAD RADS 2. ?  ?4.  Consider non atherosclerotic causes of chest pain. ?  ?5.  Recommend preventive therapy and risk factor modification. ?  ?6.  This study has been submitted for FFR analysis. ?  ?Traci Turner ?  ?  ?Electronically Signed ?  By: Craig [name] ?  On: 12/30/2019 11:00 ? ?1. Left Main: FNo significant stenosis. LM FFR = 0.97. ?  ?2. LAD: No significant stenosis. Proxima FFR = 0.95, Mid FFR = 0.90, ?Distal FFR = 0.81. ?  ?3. LCX: No significant stenosis. Proximal FFR = 0.96, Mid FFR = ?0.94, Distal FFR = 0.89. ?  ?4. OM1: No significant stenosis. Proximal FFR = 0.95, Mid FFR = ?0.92, Distal FFR = 0.87. ?  ?5. RCA: No significant stenosis. Proximal FFR = 0.98, Mid FFR = ?0.92, Distal FFR = 0.90. ?  ?IMPRESSION: ?1. Coronary  CTA FFR flow analysis demonstrates no flow limiting ?lesions. ?  ?Traci Turner ?  ?  ?Electronically Signed ?  By: Craig [name] ?  On: 01/14/2020 11:01  ? ? ?Assessment & Plan  ?  ?1.  Preoperative Cardio

## 2021-07-20 DIAGNOSIS — Z20822 Contact with and (suspected) exposure to covid-19: Secondary | ICD-10-CM | POA: Diagnosis not present

## 2021-07-20 DIAGNOSIS — R059 Cough, unspecified: Secondary | ICD-10-CM | POA: Diagnosis not present

## 2021-07-20 DIAGNOSIS — R051 Acute cough: Secondary | ICD-10-CM | POA: Diagnosis not present

## 2021-07-28 DIAGNOSIS — L821 Other seborrheic keratosis: Secondary | ICD-10-CM | POA: Diagnosis not present

## 2021-07-28 DIAGNOSIS — L814 Other melanin hyperpigmentation: Secondary | ICD-10-CM | POA: Diagnosis not present

## 2021-07-28 DIAGNOSIS — D225 Melanocytic nevi of trunk: Secondary | ICD-10-CM | POA: Diagnosis not present

## 2021-07-28 DIAGNOSIS — D2261 Melanocytic nevi of right upper limb, including shoulder: Secondary | ICD-10-CM | POA: Diagnosis not present

## 2021-07-28 DIAGNOSIS — D2371 Other benign neoplasm of skin of right lower limb, including hip: Secondary | ICD-10-CM | POA: Diagnosis not present

## 2021-07-28 DIAGNOSIS — D1801 Hemangioma of skin and subcutaneous tissue: Secondary | ICD-10-CM | POA: Diagnosis not present

## 2021-07-28 DIAGNOSIS — L570 Actinic keratosis: Secondary | ICD-10-CM | POA: Diagnosis not present

## 2021-07-31 DIAGNOSIS — Z20822 Contact with and (suspected) exposure to covid-19: Secondary | ICD-10-CM | POA: Diagnosis not present

## 2021-08-20 DIAGNOSIS — H903 Sensorineural hearing loss, bilateral: Secondary | ICD-10-CM | POA: Diagnosis not present

## 2021-09-14 DIAGNOSIS — Y999 Unspecified external cause status: Secondary | ICD-10-CM | POA: Diagnosis not present

## 2021-09-14 DIAGNOSIS — M7541 Impingement syndrome of right shoulder: Secondary | ICD-10-CM | POA: Diagnosis not present

## 2021-09-14 DIAGNOSIS — G8918 Other acute postprocedural pain: Secondary | ICD-10-CM | POA: Diagnosis not present

## 2021-09-14 DIAGNOSIS — S43431A Superior glenoid labrum lesion of right shoulder, initial encounter: Secondary | ICD-10-CM | POA: Diagnosis not present

## 2021-09-14 DIAGNOSIS — X58XXXA Exposure to other specified factors, initial encounter: Secondary | ICD-10-CM | POA: Diagnosis not present

## 2021-09-14 DIAGNOSIS — M75111 Incomplete rotator cuff tear or rupture of right shoulder, not specified as traumatic: Secondary | ICD-10-CM | POA: Diagnosis not present

## 2021-09-14 HISTORY — PX: SHOULDER ARTHROSCOPY WITH ROTATOR CUFF REPAIR: SHX5685

## 2021-09-24 DIAGNOSIS — Z9889 Other specified postprocedural states: Secondary | ICD-10-CM | POA: Diagnosis not present

## 2021-09-24 DIAGNOSIS — M25511 Pain in right shoulder: Secondary | ICD-10-CM | POA: Diagnosis not present

## 2021-09-29 DIAGNOSIS — M25511 Pain in right shoulder: Secondary | ICD-10-CM | POA: Diagnosis not present

## 2021-09-29 DIAGNOSIS — S46011D Strain of muscle(s) and tendon(s) of the rotator cuff of right shoulder, subsequent encounter: Secondary | ICD-10-CM | POA: Diagnosis not present

## 2021-10-01 DIAGNOSIS — R3912 Poor urinary stream: Secondary | ICD-10-CM | POA: Diagnosis not present

## 2021-10-01 DIAGNOSIS — R351 Nocturia: Secondary | ICD-10-CM | POA: Diagnosis not present

## 2021-10-05 DIAGNOSIS — M25511 Pain in right shoulder: Secondary | ICD-10-CM | POA: Diagnosis not present

## 2021-10-05 DIAGNOSIS — S46011D Strain of muscle(s) and tendon(s) of the rotator cuff of right shoulder, subsequent encounter: Secondary | ICD-10-CM | POA: Diagnosis not present

## 2021-10-07 DIAGNOSIS — M25511 Pain in right shoulder: Secondary | ICD-10-CM | POA: Diagnosis not present

## 2021-10-07 DIAGNOSIS — S46011D Strain of muscle(s) and tendon(s) of the rotator cuff of right shoulder, subsequent encounter: Secondary | ICD-10-CM | POA: Diagnosis not present

## 2021-10-12 DIAGNOSIS — S46011D Strain of muscle(s) and tendon(s) of the rotator cuff of right shoulder, subsequent encounter: Secondary | ICD-10-CM | POA: Diagnosis not present

## 2021-10-12 DIAGNOSIS — M25511 Pain in right shoulder: Secondary | ICD-10-CM | POA: Diagnosis not present

## 2021-10-14 DIAGNOSIS — M25511 Pain in right shoulder: Secondary | ICD-10-CM | POA: Diagnosis not present

## 2021-10-14 DIAGNOSIS — S46011D Strain of muscle(s) and tendon(s) of the rotator cuff of right shoulder, subsequent encounter: Secondary | ICD-10-CM | POA: Diagnosis not present

## 2021-10-19 DIAGNOSIS — M25511 Pain in right shoulder: Secondary | ICD-10-CM | POA: Diagnosis not present

## 2021-10-19 DIAGNOSIS — S46011D Strain of muscle(s) and tendon(s) of the rotator cuff of right shoulder, subsequent encounter: Secondary | ICD-10-CM | POA: Diagnosis not present

## 2021-10-21 DIAGNOSIS — S46011D Strain of muscle(s) and tendon(s) of the rotator cuff of right shoulder, subsequent encounter: Secondary | ICD-10-CM | POA: Diagnosis not present

## 2021-10-21 DIAGNOSIS — M25511 Pain in right shoulder: Secondary | ICD-10-CM | POA: Diagnosis not present

## 2021-10-26 DIAGNOSIS — M25511 Pain in right shoulder: Secondary | ICD-10-CM | POA: Diagnosis not present

## 2021-10-26 DIAGNOSIS — S46011D Strain of muscle(s) and tendon(s) of the rotator cuff of right shoulder, subsequent encounter: Secondary | ICD-10-CM | POA: Diagnosis not present

## 2021-10-28 DIAGNOSIS — R351 Nocturia: Secondary | ICD-10-CM | POA: Diagnosis not present

## 2021-10-28 DIAGNOSIS — S46011D Strain of muscle(s) and tendon(s) of the rotator cuff of right shoulder, subsequent encounter: Secondary | ICD-10-CM | POA: Diagnosis not present

## 2021-10-28 DIAGNOSIS — M25511 Pain in right shoulder: Secondary | ICD-10-CM | POA: Diagnosis not present

## 2021-10-28 DIAGNOSIS — R3912 Poor urinary stream: Secondary | ICD-10-CM | POA: Diagnosis not present

## 2021-11-02 DIAGNOSIS — S46011D Strain of muscle(s) and tendon(s) of the rotator cuff of right shoulder, subsequent encounter: Secondary | ICD-10-CM | POA: Diagnosis not present

## 2021-11-02 DIAGNOSIS — M25511 Pain in right shoulder: Secondary | ICD-10-CM | POA: Diagnosis not present

## 2021-11-04 DIAGNOSIS — S46011D Strain of muscle(s) and tendon(s) of the rotator cuff of right shoulder, subsequent encounter: Secondary | ICD-10-CM | POA: Diagnosis not present

## 2021-11-04 DIAGNOSIS — M25511 Pain in right shoulder: Secondary | ICD-10-CM | POA: Diagnosis not present

## 2021-11-09 DIAGNOSIS — S46011D Strain of muscle(s) and tendon(s) of the rotator cuff of right shoulder, subsequent encounter: Secondary | ICD-10-CM | POA: Diagnosis not present

## 2021-11-09 DIAGNOSIS — M25511 Pain in right shoulder: Secondary | ICD-10-CM | POA: Diagnosis not present

## 2021-11-11 DIAGNOSIS — S46011D Strain of muscle(s) and tendon(s) of the rotator cuff of right shoulder, subsequent encounter: Secondary | ICD-10-CM | POA: Diagnosis not present

## 2021-11-11 DIAGNOSIS — M25511 Pain in right shoulder: Secondary | ICD-10-CM | POA: Diagnosis not present

## 2021-11-16 DIAGNOSIS — M25511 Pain in right shoulder: Secondary | ICD-10-CM | POA: Diagnosis not present

## 2021-11-16 DIAGNOSIS — S46011D Strain of muscle(s) and tendon(s) of the rotator cuff of right shoulder, subsequent encounter: Secondary | ICD-10-CM | POA: Diagnosis not present

## 2021-11-23 DIAGNOSIS — M25511 Pain in right shoulder: Secondary | ICD-10-CM | POA: Diagnosis not present

## 2021-11-23 DIAGNOSIS — S46011D Strain of muscle(s) and tendon(s) of the rotator cuff of right shoulder, subsequent encounter: Secondary | ICD-10-CM | POA: Diagnosis not present

## 2021-12-09 DIAGNOSIS — M25511 Pain in right shoulder: Secondary | ICD-10-CM | POA: Diagnosis not present

## 2021-12-09 DIAGNOSIS — S46011D Strain of muscle(s) and tendon(s) of the rotator cuff of right shoulder, subsequent encounter: Secondary | ICD-10-CM | POA: Diagnosis not present

## 2021-12-10 DIAGNOSIS — R3912 Poor urinary stream: Secondary | ICD-10-CM | POA: Diagnosis not present

## 2021-12-10 DIAGNOSIS — R310 Gross hematuria: Secondary | ICD-10-CM | POA: Diagnosis not present

## 2021-12-14 DIAGNOSIS — S46011D Strain of muscle(s) and tendon(s) of the rotator cuff of right shoulder, subsequent encounter: Secondary | ICD-10-CM | POA: Diagnosis not present

## 2021-12-14 DIAGNOSIS — M25511 Pain in right shoulder: Secondary | ICD-10-CM | POA: Diagnosis not present

## 2021-12-17 DIAGNOSIS — S46011D Strain of muscle(s) and tendon(s) of the rotator cuff of right shoulder, subsequent encounter: Secondary | ICD-10-CM | POA: Diagnosis not present

## 2021-12-17 DIAGNOSIS — M25511 Pain in right shoulder: Secondary | ICD-10-CM | POA: Diagnosis not present

## 2021-12-21 DIAGNOSIS — S46011D Strain of muscle(s) and tendon(s) of the rotator cuff of right shoulder, subsequent encounter: Secondary | ICD-10-CM | POA: Diagnosis not present

## 2021-12-21 DIAGNOSIS — M25511 Pain in right shoulder: Secondary | ICD-10-CM | POA: Diagnosis not present

## 2021-12-23 DIAGNOSIS — N201 Calculus of ureter: Secondary | ICD-10-CM | POA: Diagnosis not present

## 2021-12-23 DIAGNOSIS — N281 Cyst of kidney, acquired: Secondary | ICD-10-CM | POA: Diagnosis not present

## 2021-12-23 DIAGNOSIS — N200 Calculus of kidney: Secondary | ICD-10-CM | POA: Diagnosis not present

## 2021-12-23 DIAGNOSIS — R310 Gross hematuria: Secondary | ICD-10-CM | POA: Diagnosis not present

## 2021-12-24 DIAGNOSIS — M25511 Pain in right shoulder: Secondary | ICD-10-CM | POA: Diagnosis not present

## 2021-12-24 DIAGNOSIS — S46011D Strain of muscle(s) and tendon(s) of the rotator cuff of right shoulder, subsequent encounter: Secondary | ICD-10-CM | POA: Diagnosis not present

## 2021-12-31 DIAGNOSIS — M25511 Pain in right shoulder: Secondary | ICD-10-CM | POA: Diagnosis not present

## 2021-12-31 DIAGNOSIS — S46011D Strain of muscle(s) and tendon(s) of the rotator cuff of right shoulder, subsequent encounter: Secondary | ICD-10-CM | POA: Diagnosis not present

## 2022-01-02 DIAGNOSIS — Z23 Encounter for immunization: Secondary | ICD-10-CM | POA: Diagnosis not present

## 2022-01-04 DIAGNOSIS — S46011D Strain of muscle(s) and tendon(s) of the rotator cuff of right shoulder, subsequent encounter: Secondary | ICD-10-CM | POA: Diagnosis not present

## 2022-01-04 DIAGNOSIS — M25511 Pain in right shoulder: Secondary | ICD-10-CM | POA: Diagnosis not present

## 2022-01-05 DIAGNOSIS — R911 Solitary pulmonary nodule: Secondary | ICD-10-CM | POA: Diagnosis not present

## 2022-01-05 DIAGNOSIS — J90 Pleural effusion, not elsewhere classified: Secondary | ICD-10-CM | POA: Diagnosis not present

## 2022-01-07 DIAGNOSIS — S46011D Strain of muscle(s) and tendon(s) of the rotator cuff of right shoulder, subsequent encounter: Secondary | ICD-10-CM | POA: Diagnosis not present

## 2022-01-07 DIAGNOSIS — M25511 Pain in right shoulder: Secondary | ICD-10-CM | POA: Diagnosis not present

## 2022-01-11 DIAGNOSIS — N400 Enlarged prostate without lower urinary tract symptoms: Secondary | ICD-10-CM | POA: Diagnosis not present

## 2022-01-11 DIAGNOSIS — I700 Atherosclerosis of aorta: Secondary | ICD-10-CM | POA: Diagnosis not present

## 2022-01-11 DIAGNOSIS — E785 Hyperlipidemia, unspecified: Secondary | ICD-10-CM | POA: Diagnosis not present

## 2022-01-11 DIAGNOSIS — Z86718 Personal history of other venous thrombosis and embolism: Secondary | ICD-10-CM | POA: Diagnosis not present

## 2022-01-11 DIAGNOSIS — S46011D Strain of muscle(s) and tendon(s) of the rotator cuff of right shoulder, subsequent encounter: Secondary | ICD-10-CM | POA: Diagnosis not present

## 2022-01-11 DIAGNOSIS — D696 Thrombocytopenia, unspecified: Secondary | ICD-10-CM | POA: Diagnosis not present

## 2022-01-11 DIAGNOSIS — M25511 Pain in right shoulder: Secondary | ICD-10-CM | POA: Diagnosis not present

## 2022-01-11 DIAGNOSIS — R7309 Other abnormal glucose: Secondary | ICD-10-CM | POA: Diagnosis not present

## 2022-01-12 DIAGNOSIS — M25511 Pain in right shoulder: Secondary | ICD-10-CM | POA: Diagnosis not present

## 2022-01-21 DIAGNOSIS — S46011D Strain of muscle(s) and tendon(s) of the rotator cuff of right shoulder, subsequent encounter: Secondary | ICD-10-CM | POA: Diagnosis not present

## 2022-01-21 DIAGNOSIS — M25511 Pain in right shoulder: Secondary | ICD-10-CM | POA: Diagnosis not present

## 2022-01-25 DIAGNOSIS — R310 Gross hematuria: Secondary | ICD-10-CM | POA: Diagnosis not present

## 2022-01-25 DIAGNOSIS — N401 Enlarged prostate with lower urinary tract symptoms: Secondary | ICD-10-CM | POA: Diagnosis not present

## 2022-01-25 DIAGNOSIS — R3912 Poor urinary stream: Secondary | ICD-10-CM | POA: Diagnosis not present

## 2022-01-25 DIAGNOSIS — N411 Chronic prostatitis: Secondary | ICD-10-CM | POA: Diagnosis not present

## 2022-01-25 DIAGNOSIS — M25511 Pain in right shoulder: Secondary | ICD-10-CM | POA: Diagnosis not present

## 2022-01-25 DIAGNOSIS — S46011D Strain of muscle(s) and tendon(s) of the rotator cuff of right shoulder, subsequent encounter: Secondary | ICD-10-CM | POA: Diagnosis not present

## 2022-01-28 DIAGNOSIS — M25511 Pain in right shoulder: Secondary | ICD-10-CM | POA: Diagnosis not present

## 2022-01-28 DIAGNOSIS — S46011D Strain of muscle(s) and tendon(s) of the rotator cuff of right shoulder, subsequent encounter: Secondary | ICD-10-CM | POA: Diagnosis not present

## 2022-02-22 DIAGNOSIS — G894 Chronic pain syndrome: Secondary | ICD-10-CM | POA: Diagnosis not present

## 2022-02-22 DIAGNOSIS — M545 Low back pain, unspecified: Secondary | ICD-10-CM | POA: Diagnosis not present

## 2022-02-22 DIAGNOSIS — R030 Elevated blood-pressure reading, without diagnosis of hypertension: Secondary | ICD-10-CM | POA: Diagnosis not present

## 2022-02-22 DIAGNOSIS — G8929 Other chronic pain: Secondary | ICD-10-CM | POA: Diagnosis not present

## 2022-02-22 DIAGNOSIS — M542 Cervicalgia: Secondary | ICD-10-CM | POA: Diagnosis not present

## 2022-02-22 DIAGNOSIS — Z79899 Other long term (current) drug therapy: Secondary | ICD-10-CM | POA: Diagnosis not present

## 2022-02-22 DIAGNOSIS — Z6829 Body mass index (BMI) 29.0-29.9, adult: Secondary | ICD-10-CM | POA: Diagnosis not present

## 2022-02-25 DIAGNOSIS — M545 Low back pain, unspecified: Secondary | ICD-10-CM | POA: Diagnosis not present

## 2022-02-25 DIAGNOSIS — M546 Pain in thoracic spine: Secondary | ICD-10-CM | POA: Diagnosis not present

## 2022-03-07 DIAGNOSIS — M47816 Spondylosis without myelopathy or radiculopathy, lumbar region: Secondary | ICD-10-CM | POA: Diagnosis not present

## 2022-03-08 DIAGNOSIS — M47816 Spondylosis without myelopathy or radiculopathy, lumbar region: Secondary | ICD-10-CM | POA: Diagnosis not present

## 2022-03-15 DIAGNOSIS — M47816 Spondylosis without myelopathy or radiculopathy, lumbar region: Secondary | ICD-10-CM | POA: Diagnosis not present

## 2022-03-16 DIAGNOSIS — M47816 Spondylosis without myelopathy or radiculopathy, lumbar region: Secondary | ICD-10-CM | POA: Diagnosis not present

## 2022-03-23 DIAGNOSIS — M47816 Spondylosis without myelopathy or radiculopathy, lumbar region: Secondary | ICD-10-CM | POA: Diagnosis not present

## 2022-03-29 DIAGNOSIS — M47816 Spondylosis without myelopathy or radiculopathy, lumbar region: Secondary | ICD-10-CM | POA: Diagnosis not present

## 2022-03-31 DIAGNOSIS — M47816 Spondylosis without myelopathy or radiculopathy, lumbar region: Secondary | ICD-10-CM | POA: Diagnosis not present

## 2022-04-05 DIAGNOSIS — M47816 Spondylosis without myelopathy or radiculopathy, lumbar region: Secondary | ICD-10-CM | POA: Diagnosis not present

## 2022-04-08 DIAGNOSIS — M47816 Spondylosis without myelopathy or radiculopathy, lumbar region: Secondary | ICD-10-CM | POA: Diagnosis not present

## 2022-04-27 ENCOUNTER — Encounter: Payer: Self-pay | Admitting: Pulmonary Disease

## 2022-04-27 ENCOUNTER — Ambulatory Visit (INDEPENDENT_AMBULATORY_CARE_PROVIDER_SITE_OTHER): Payer: Medicare Other | Admitting: Pulmonary Disease

## 2022-04-27 VITALS — BP 140/78 | HR 78 | Ht 69.0 in | Wt 202.0 lb

## 2022-04-27 DIAGNOSIS — R911 Solitary pulmonary nodule: Secondary | ICD-10-CM

## 2022-04-27 NOTE — Progress Notes (Signed)
Craig [name]    [id number]    Sep 21, 1950  Primary Care Physician:Morrow, Marjory Lies, MD  Referring Physician: London Pepper, MD [address] [place],  [place] [address]  Chief complaint:   Being seen for an abnormal CT scan of the chest  HPI:  Patient was seen previously by myself in 2020 for lung nodules  Recently had a CT scan of the chest performed showing right lower lobe nodules When this CT was compared to previous, nodules are new, the nodules that were present on the previous CT have resolved  Previous CT was in the context of follow-up for weight loss pleuritic chest pain-the nodules are concerning for inflammatory versus infectious process, did have a thoracentesis that did not reveal any malignant cells  He is currently asymptomatic, not coughing, no shortness of breath  No chest pain or chest discomfort He remains very active  He was in the [employer] 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   diphenhydrAMINE (BENADRYL) 25 MG tablet Take 25 mg by mouth every 6 (six) hours as needed.   HYDROcodone-acetaminophen (NORCO/VICODIN) 5-325 MG tablet Take 1 tablet by mouth every 4 (four) hours as needed for moderate pain.   rivaroxaban (XARELTO) 20 MG TABS tablet Take 1 tablet (20 mg total) by mouth at bedtime.   tamsulosin (FLOMAX) 0.4 MG CAPS capsule Take 0.8 mg by mouth daily.   traMADol (ULTRAM) 50 MG tablet Take 50 mg by mouth 3 (three) times daily as needed (pain).   trimethoprim (TRIMPEX) 100 MG tablet Take 100 mg by mouth daily.   loratadine (CLARITIN) 10 MG tablet Take 10 mg by mouth daily.   metoprolol tartrate (LOPRESSOR) 100 MG tablet Take 1 tablet (100 mg total) two hours prior to CT scan.   TIZANIDINE HCL PO Take by mouth. PT NOT SURE OF DOSAGE AS ONLY PRESCRIBED TODAY   No facility-administered  encounter medications on file as of 04/27/2022.    Allergies as of 04/27/2022 - Review Complete 04/27/2022  Allergen Reaction Noted   Gentamycin [gentamicin] Other (See Comments) 03/23/2018   Nsaids Other (See Comments) 03/23/2018   Vancomycin Rash 03/23/2018    Past Medical History:  Diagnosis Date   Back pain    BPH (benign prostatic hyperplasia)    CAD (coronary artery disease) 2021   Cervical radicular pain    DVT of lower extremity (deep venous thrombosis) (HCC)    Elevated glucose    Family history of adverse reaction to anesthesia    daughter gets nauseated   History of kidney stones    Hyperlipemia    Multiple lung nodules on CT    Prostatitis    Sleep apnea    wears CPAP    Past Surgical History:  Procedure Laterality Date   BACK SURGERY     CERVICAL SPINE SURGERY     EXTRACORPOREAL SHOCK WAVE LITHOTRIPSY Left 12/31/2018   Procedure: EXTRACORPOREAL SHOCK WAVE LITHOTRIPSY (ESWL);  Surgeon: [name], MD;  Location: [facility];  Service: Urology;  Laterality: Left;   IR THORACENTESIS ASP PLEURAL SPACE W/IMG GUIDE  04/23/2018   urinary stent Left     Family History  Problem Relation Age of Onset   Colon cancer Mother     Social History   Socioeconomic History   Marital status: Married  Spouse name: Not on file   Number of children: Not on file   Years of education: Not on file   Highest education level: Not on file  Occupational History   Not on file  Tobacco Use   Smoking status: Never   Smokeless tobacco: Never  Substance and Sexual Activity   Alcohol use: Yes    Comment: occa   Drug use: Never   Sexual activity: Not on file  Other Topics Concern   Not on file  Social History Narrative   Not on file   Social Determinants of Health   Financial Resource Strain: Not on file  Food Insecurity: Not on file  Transportation Needs: Not on file  Physical Activity: Not on file  Stress: Not on file  Social Connections: Not on file  Intimate  Partner Violence: Not on file    Review of Systems  Respiratory:  Negative for cough and shortness of breath.     Vitals:   04/27/22 1309  BP: (!) 140/78  Pulse: 78  SpO2: 96%     Physical Exam Constitutional:      Appearance: Normal appearance.  HENT:     Head: Normocephalic.     Mouth/Throat:     Mouth: Mucous membranes are moist.  Eyes:     General: No scleral icterus. Cardiovascular:     Rate and Rhythm: Normal rate and regular rhythm.     Heart sounds: No murmur heard.    No friction rub.  Pulmonary:     Effort: No respiratory distress.     Breath sounds: No stridor. No wheezing or rhonchi.  Musculoskeletal:     Cervical back: No rigidity or tenderness.  Neurological:     Mental Status: He is alert.  Psychiatric:        Mood and Affect: Mood normal.      Data Reviewed: Previous CT from 2020 reviewed  New CT from October 2023 reviewed, compared with abdominal CT performed a month previous  Assessment:  Lung nodules -May be inflammation versus infectious etiology -Less likely related to a malignant process  Previous nodularity noted on previous CT did completely resolved  History of sleep apnea -On CPAP therapy  Plan/Recommendations: Repeat CT scan in 3 months to follow-up lung nodules  Consideration for PET scan if there is any progressive increase in the size of the nodules  Encouraged to call with any significant concerns  Tentative follow-up in about 3 months, I will see him after CT   Sherrilyn Rist MD [facility] 04/27/2022, 1:37 PM  CC: London Pepper, MD

## 2022-04-27 NOTE — Patient Instructions (Signed)
Schedule for CT scan with contrast for lung nodules  CT scan can be done in the next 2 to 3 weeks I will see you about a week after  Call with significant concerns  Further follow-up will depend on findings on the CT scan, further follow-up may include PET scan  Follow-up in about 4 to 5 weeks

## 2022-05-10 ENCOUNTER — Ambulatory Visit (HOSPITAL_COMMUNITY)
Admission: RE | Admit: 2022-05-10 | Discharge: 2022-05-10 | Disposition: A | Discharge status: Home / Self Care | Payer: Medicare Other | Source: Ambulatory Visit | Attending: Pulmonary Disease | Admitting: Pulmonary Disease

## 2022-05-10 DIAGNOSIS — R918 Other nonspecific abnormal finding of lung field: Secondary | ICD-10-CM | POA: Diagnosis not present

## 2022-05-10 DIAGNOSIS — R911 Solitary pulmonary nodule: Secondary | ICD-10-CM | POA: Insufficient documentation

## 2022-05-10 MED ORDER — IOHEXOL 350 MG/ML SOLN
75.0000 mL | Freq: Once | INTRAVENOUS | Status: AC | PRN
  Administered 2022-05-10: 75 mL via INTRAVENOUS

## 2022-05-31 ENCOUNTER — Encounter: Payer: Self-pay | Admitting: Pulmonary Disease

## 2022-05-31 ENCOUNTER — Ambulatory Visit (INDEPENDENT_AMBULATORY_CARE_PROVIDER_SITE_OTHER): Payer: Medicare Other | Admitting: Pulmonary Disease

## 2022-05-31 VITALS — BP 122/62 | HR 80 | Ht 69.0 in | Wt 204.0 lb

## 2022-05-31 DIAGNOSIS — R911 Solitary pulmonary nodule: Secondary | ICD-10-CM | POA: Diagnosis not present

## 2022-05-31 NOTE — Patient Instructions (Signed)
I will see you back here in about 6 months -Appointment should be 1 or 2 weeks following CT scan  Obtain CT scan of the chest-to be done 6 months from here  Continue graded exercise as tolerated  Call us with any significant concerns

## 2022-05-31 NOTE — Progress Notes (Signed)
Craig [name]    [id number]    [birth year]  Primary Care Physician:Morrow, Marjory Lies, MD  Referring Physician: London Pepper, MD [name],  [place] [address]  Chief complaint:   Being seen for an abnormal CT scan of the chest  HPI:  Patient was seen previously by myself in 2020 for lung nodules  Had a CT in October 2023 and 1 in February 2024 showing multiple nodules that have been fleeting  Denies any significant symptoms Over the last couple of days may be some nasal stuffiness congestion from environmental exposure otherwise has been feeling well Exercises regularly Weight is controlled No significant unusual musculoskeletal pain or discomfort No skin rash  Denies chest pain or chest discomfort 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   diphenhydrAMINE (BENADRYL) 25 MG tablet Take 25 mg by mouth every 6 (six) hours as needed.   HYDROcodone-acetaminophen (NORCO/VICODIN) 5-325 MG tablet Take 1 tablet by mouth every 4 (four) hours as needed for moderate pain.   rivaroxaban (XARELTO) 20 MG TABS tablet Take 1 tablet (20 mg total) by mouth at bedtime.   tamsulosin (FLOMAX) 0.4 MG CAPS capsule Take 0.8 mg by mouth daily.   traMADol (ULTRAM) 50 MG tablet Take 50 mg by mouth 3 (three) times daily as needed (pain).   trimethoprim (TRIMPEX) 100 MG tablet Take 100 mg by mouth daily.   [DISCONTINUED] loratadine (CLARITIN) 10 MG tablet Take 10 mg by mouth daily.   [DISCONTINUED] metoprolol tartrate (LOPRESSOR) 100 MG tablet Take 1 tablet (100 mg total) two hours prior to CT scan.   [DISCONTINUED] TIZANIDINE HCL PO Take by mouth. PT NOT SURE OF DOSAGE AS ONLY PRESCRIBED TODAY   No facility-administered encounter medications on file as of 05/31/2022.    Allergies as of 05/31/2022 - Review  Complete 05/31/2022  Allergen Reaction Noted   Gentamycin [gentamicin] Other (See Comments) 03/23/2018   Nsaids Other (See Comments) 03/23/2018   Other Other (See Comments) 03/23/2018   Vancomycin Rash 03/23/2018    Past Medical History:  Diagnosis Date   Back pain    BPH (benign prostatic hyperplasia)    CAD (coronary artery disease) 2021   Cervical radicular pain    DVT of lower extremity (deep venous thrombosis) (HCC)    Elevated glucose    Family history of adverse reaction to anesthesia    daughter gets nauseated   History of kidney stones    Hyperlipemia    Multiple lung nodules on CT    Prostatitis    Sleep apnea    wears CPAP    Past Surgical History:  Procedure Laterality Date   BACK SURGERY     CERVICAL SPINE SURGERY     EXTRACORPOREAL SHOCK WAVE LITHOTRIPSY Left 12/31/2018   Procedure: EXTRACORPOREAL SHOCK WAVE LITHOTRIPSY (ESWL);  Surgeon: [name], MD;  Location: [facility];  Service: Urology;  Laterality: Left;   IR THORACENTESIS ASP PLEURAL SPACE W/IMG GUIDE  04/23/2018   urinary stent Left     Family History  Problem Relation Age of Onset   Colon cancer Mother     Social History   Socioeconomic History   Marital status: Married    Spouse name: Not on file   Number of children: Not on file  Years of education: Not on file   Highest education level: Not on file  Occupational History   Not on file  Tobacco Use   Smoking status: Never   Smokeless tobacco: Never  Substance and Sexual Activity   Alcohol use: Yes    Comment: occa   Drug use: Never   Sexual activity: Not on file  Other Topics Concern   Not on file  Social History Narrative   Not on file   Social Determinants of Health   Financial Resource Strain: Not on file  Food Insecurity: Not on file  Transportation Needs: Not on file  Physical Activity: Not on file  Stress: Not on file  Social Connections: Not on file  Intimate Partner Violence: Not on file    Review of  Systems  Respiratory:  Negative for cough and shortness of breath.     Vitals:   05/31/22 0826  BP: 122/62  Pulse: 80  SpO2: 96%     Physical Exam Constitutional:      Appearance: Normal appearance.  HENT:     Head: Normocephalic.     Mouth/Throat:     Mouth: Mucous membranes are moist.  Eyes:     General: No scleral icterus. Cardiovascular:     Rate and Rhythm: Normal rate and regular rhythm.     Heart sounds: No murmur heard.    No friction rub.  Pulmonary:     Effort: No respiratory distress.     Breath sounds: No stridor. No wheezing or rhonchi.  Musculoskeletal:     Cervical back: No rigidity or tenderness.  Neurological:     Mental Status: He is alert.  Psychiatric:        Mood and Affect: Mood normal.      Data Reviewed: Previous CT from 2020 reviewed  Multiple CTs completed in the office today including the 1 from 05/10/2022, 12/30/2019, 04/03/2019-fleeting nodularity  Assessment:  Lung nodules -Likely inflammatory, less likely infectious etiology -Unlikely to be related to a malignant process  Previous nodularity seen on previous CT scans completely resolved  History of obstructive sleep apnea -Wears a dental device  Plan/Recommendations: Repeat CT scan in 6 months  I will see him after the neck CT  Encouraged to call with any significant concerns    Sherrilyn Rist MD [facility] 05/31/2022, 8:42 AM  CC: London Pepper, MD

## 2022-06-29 DIAGNOSIS — Z23 Encounter for immunization: Secondary | ICD-10-CM | POA: Diagnosis not present

## 2022-07-20 DIAGNOSIS — M47816 Spondylosis without myelopathy or radiculopathy, lumbar region: Secondary | ICD-10-CM | POA: Diagnosis not present

## 2022-07-21 DIAGNOSIS — Z86718 Personal history of other venous thrombosis and embolism: Secondary | ICD-10-CM | POA: Diagnosis not present

## 2022-07-21 DIAGNOSIS — Z0000 Encounter for general adult medical examination without abnormal findings: Secondary | ICD-10-CM | POA: Diagnosis not present

## 2022-07-21 DIAGNOSIS — R7309 Other abnormal glucose: Secondary | ICD-10-CM | POA: Diagnosis not present

## 2022-07-21 DIAGNOSIS — E785 Hyperlipidemia, unspecified: Secondary | ICD-10-CM | POA: Diagnosis not present

## 2022-07-21 DIAGNOSIS — N400 Enlarged prostate without lower urinary tract symptoms: Secondary | ICD-10-CM | POA: Diagnosis not present

## 2022-07-21 DIAGNOSIS — I700 Atherosclerosis of aorta: Secondary | ICD-10-CM | POA: Diagnosis not present

## 2022-07-26 DIAGNOSIS — N411 Chronic prostatitis: Secondary | ICD-10-CM | POA: Diagnosis not present

## 2022-07-26 DIAGNOSIS — R3912 Poor urinary stream: Secondary | ICD-10-CM | POA: Diagnosis not present

## 2022-07-26 DIAGNOSIS — R351 Nocturia: Secondary | ICD-10-CM | POA: Diagnosis not present

## 2022-07-26 DIAGNOSIS — Z125 Encounter for screening for malignant neoplasm of prostate: Secondary | ICD-10-CM | POA: Diagnosis not present

## 2022-07-26 DIAGNOSIS — N401 Enlarged prostate with lower urinary tract symptoms: Secondary | ICD-10-CM | POA: Diagnosis not present

## 2022-08-02 DIAGNOSIS — D225 Melanocytic nevi of trunk: Secondary | ICD-10-CM | POA: Diagnosis not present

## 2022-08-02 DIAGNOSIS — D485 Neoplasm of uncertain behavior of skin: Secondary | ICD-10-CM | POA: Diagnosis not present

## 2022-08-02 DIAGNOSIS — D2271 Melanocytic nevi of right lower limb, including hip: Secondary | ICD-10-CM | POA: Diagnosis not present

## 2022-08-02 DIAGNOSIS — L814 Other melanin hyperpigmentation: Secondary | ICD-10-CM | POA: Diagnosis not present

## 2022-08-02 DIAGNOSIS — L821 Other seborrheic keratosis: Secondary | ICD-10-CM | POA: Diagnosis not present

## 2022-08-02 DIAGNOSIS — B079 Viral wart, unspecified: Secondary | ICD-10-CM | POA: Diagnosis not present

## 2022-09-22 DIAGNOSIS — R030 Elevated blood-pressure reading, without diagnosis of hypertension: Secondary | ICD-10-CM | POA: Diagnosis not present

## 2022-09-22 DIAGNOSIS — Z6829 Body mass index (BMI) 29.0-29.9, adult: Secondary | ICD-10-CM | POA: Diagnosis not present

## 2022-09-22 DIAGNOSIS — Z79899 Other long term (current) drug therapy: Secondary | ICD-10-CM | POA: Diagnosis not present

## 2022-09-22 DIAGNOSIS — M545 Low back pain, unspecified: Secondary | ICD-10-CM | POA: Diagnosis not present

## 2022-09-22 DIAGNOSIS — G8929 Other chronic pain: Secondary | ICD-10-CM | POA: Diagnosis not present

## 2022-09-22 DIAGNOSIS — M542 Cervicalgia: Secondary | ICD-10-CM | POA: Diagnosis not present

## 2022-09-28 DIAGNOSIS — R3912 Poor urinary stream: Secondary | ICD-10-CM | POA: Diagnosis not present

## 2022-09-28 DIAGNOSIS — N401 Enlarged prostate with lower urinary tract symptoms: Secondary | ICD-10-CM | POA: Diagnosis not present

## 2022-10-18 DIAGNOSIS — R310 Gross hematuria: Secondary | ICD-10-CM | POA: Diagnosis not present

## 2022-11-01 DIAGNOSIS — R932 Abnormal findings on diagnostic imaging of liver and biliary tract: Secondary | ICD-10-CM | POA: Diagnosis not present

## 2022-11-01 DIAGNOSIS — N202 Calculus of kidney with calculus of ureter: Secondary | ICD-10-CM | POA: Diagnosis not present

## 2022-11-01 DIAGNOSIS — K5730 Diverticulosis of large intestine without perforation or abscess without bleeding: Secondary | ICD-10-CM | POA: Diagnosis not present

## 2022-11-01 DIAGNOSIS — N281 Cyst of kidney, acquired: Secondary | ICD-10-CM | POA: Diagnosis not present

## 2022-11-01 DIAGNOSIS — R310 Gross hematuria: Secondary | ICD-10-CM | POA: Diagnosis not present

## 2022-11-22 DIAGNOSIS — R3912 Poor urinary stream: Secondary | ICD-10-CM | POA: Diagnosis not present

## 2022-11-22 DIAGNOSIS — N401 Enlarged prostate with lower urinary tract symptoms: Secondary | ICD-10-CM | POA: Diagnosis not present

## 2022-11-22 DIAGNOSIS — R351 Nocturia: Secondary | ICD-10-CM | POA: Diagnosis not present

## 2022-11-22 DIAGNOSIS — R350 Frequency of micturition: Secondary | ICD-10-CM | POA: Diagnosis not present

## 2022-11-29 ENCOUNTER — Other Ambulatory Visit: Payer: Self-pay | Admitting: Urology

## 2022-11-29 DIAGNOSIS — N201 Calculus of ureter: Secondary | ICD-10-CM | POA: Diagnosis not present

## 2022-11-29 DIAGNOSIS — R3912 Poor urinary stream: Secondary | ICD-10-CM | POA: Diagnosis not present

## 2022-11-29 DIAGNOSIS — N401 Enlarged prostate with lower urinary tract symptoms: Secondary | ICD-10-CM | POA: Diagnosis not present

## 2022-11-30 DIAGNOSIS — K625 Hemorrhage of anus and rectum: Secondary | ICD-10-CM | POA: Diagnosis not present

## 2022-11-30 DIAGNOSIS — K219 Gastro-esophageal reflux disease without esophagitis: Secondary | ICD-10-CM | POA: Diagnosis not present

## 2022-12-01 ENCOUNTER — Ambulatory Visit
Admission: RE | Admit: 2022-12-01 | Discharge: 2022-12-01 | Disposition: A | Discharge status: Home / Self Care | Payer: Medicare Other | Source: Ambulatory Visit | Attending: Pulmonary Disease | Admitting: Pulmonary Disease

## 2022-12-01 DIAGNOSIS — R911 Solitary pulmonary nodule: Secondary | ICD-10-CM

## 2022-12-01 DIAGNOSIS — R918 Other nonspecific abnormal finding of lung field: Secondary | ICD-10-CM | POA: Diagnosis not present

## 2022-12-01 DIAGNOSIS — I2510 Atherosclerotic heart disease of native coronary artery without angina pectoris: Secondary | ICD-10-CM | POA: Diagnosis not present

## 2022-12-07 DIAGNOSIS — H25813 Combined forms of age-related cataract, bilateral: Secondary | ICD-10-CM | POA: Diagnosis not present

## 2022-12-13 ENCOUNTER — Encounter: Payer: Self-pay | Admitting: Physician Assistant

## 2022-12-13 ENCOUNTER — Ambulatory Visit: Payer: Medicare Other | Attending: Physician Assistant | Admitting: Physician Assistant

## 2022-12-13 VITALS — BP 136/86 | HR 73 | Ht 69.0 in | Wt 203.0 lb

## 2022-12-13 DIAGNOSIS — Z86718 Personal history of other venous thrombosis and embolism: Secondary | ICD-10-CM | POA: Diagnosis present

## 2022-12-13 DIAGNOSIS — E785 Hyperlipidemia, unspecified: Secondary | ICD-10-CM | POA: Diagnosis present

## 2022-12-13 DIAGNOSIS — I2510 Atherosclerotic heart disease of native coronary artery without angina pectoris: Secondary | ICD-10-CM | POA: Insufficient documentation

## 2022-12-13 DIAGNOSIS — I951 Orthostatic hypotension: Secondary | ICD-10-CM | POA: Diagnosis not present

## 2022-12-13 HISTORY — DX: Hyperlipidemia, unspecified: E78.5

## 2022-12-13 NOTE — Progress Notes (Signed)
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 male who returns for follow-up of coronary disease.  He was last seen by Dr. Mayford Knife in November 2022.    Discussed the use of AI scribe software for clinical note transcription with the patient, who gave verbal consent to proceed.    He is here alone.  He reports intermittent episodes of lightheadedness when going from a lying or seated position to a standing position.  This typically happens after exercising and during his stretching. The lightheadedness is not associated with chest discomfort, dyspnea, or syncope. He denies any chest pain or shortness of breath during exercise, stating he can complete four miles on the elliptical in fifty minutes without becoming significantly breathless. 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     ROS: See HPI. No melena, hematochezia.     Studies Reviewed:   EKG Interpretation Date/Time:  Tuesday December 13 2022 15:03:18 EDT Ventricular Rate:  73 PR Interval:  160 QRS Duration:  92 QT Interval:  354 QTC Calculation: 389 R Axis:   68  Text Interpretation: Normal sinus rhythm Normal axis Nonspecific ST abnormality Confirmed by Tereso Newcomer [phone]) on 12/13/2022 4:26:50 PM    Risk  Assessment/Calculations:             Physical Exam:   VS:  BP 136/86   Pulse 73   Ht 5\' 9"  (1.753 m)   Wt 203 lb (92.1 kg)   SpO2 97%   BMI 29.98 kg/m    Wt Readings from Last 3 Encounters:  12/13/22 203 lb (92.1 kg)  05/31/22 204 lb (92.5 kg)  04/27/22 202 lb (91.6 kg)    Constitutional:      Appearance: Healthy appearance. Not in distress.  Neck:     Vascular: No carotid bruit. JVD normal.  Pulmonary:     Breath sounds: Normal breath sounds. No wheezing. No rales.  Cardiovascular:     Normal rate. Regular rhythm.     Murmurs: There is no murmur.  Edema:    Peripheral edema absent.  Abdominal:     Palpations: Abdomen is soft.        Assessment and Plan:     Coronary Artery Disease Mild to moderate non-obstructive CAD on coronary CTA in 2021. No symptoms suggestive of angina.  -He is not on aspirin as he is on Xarelto -Continue Lipitor 10mg  daily. -Follow up in 1 year.  Hyperlipidemia LDL optimal at 51 in April 2024. -Continue Lipitor 10mg  daily.  Orthostatic intolerance Lightheadedness with positional changes, particularly after exercise. No associated chest pain or syncope. Likely due to post-exercise vasodilation and exacerbated by positional changes. -Advise to hydrate well during exercise and rise slowly from sitting or lying positions, particularly after exercise.  Recurrent  Deep Vein Thrombosis On chronic anticoagulation with Xarelto.        Dispo:  Return in about 1 year (around 12/13/2023) for Routine Follow Up w/ Dr. Mayford Knife.  Signed, Tereso Newcomer, PA-C

## 2022-12-13 NOTE — Patient Instructions (Signed)
Medication Instructions:  Your physician recommends that you continue on your current medications as directed. Please refer to the Current Medication list given to you today. *If you need a refill on your cardiac medications before your next appointment, please call your pharmacy*   Lab Work: None ordered If you have labs (blood work) drawn today and your tests are completely normal, you will receive your results only by: MyChart Message (if you have MyChart) OR A paper copy in the mail If you have any lab test that is abnormal or we need to change your treatment, we will call you to review the results.   Testing/Procedures: None ordered   Follow-Up: At [facility], you and your health needs are our priority.  As part of our continuing mission to provide you with exceptional heart care, we have created designated Provider Care Teams.  These Care Teams include your primary Cardiologist (physician) and Advanced Practice Providers (APPs -  Physician Assistants and Nurse Practitioners) who all work together to provide you with the care you need, when you need it.  We recommend signing up for the patient portal called "MyChart".  Sign up information is provided on this After Visit Summary.  MyChart is used to connect with patients for Virtual Visits (Telemedicine).  Patients are able to view lab/test results, encounter notes, upcoming appointments, etc.  Non-urgent messages can be sent to your provider as well.   To learn more about what you can do with MyChart, go to [email or web address].    Your next appointment:   12 month(s)  Provider:   Armanda Magic, MD     Other Instructions

## 2022-12-23 ENCOUNTER — Encounter: Payer: Self-pay | Admitting: Pulmonary Disease

## 2022-12-23 ENCOUNTER — Ambulatory Visit (INDEPENDENT_AMBULATORY_CARE_PROVIDER_SITE_OTHER): Payer: Medicare Other | Admitting: Pulmonary Disease

## 2022-12-23 VITALS — BP 122/80 | HR 83 | Temp 97.5°F | Ht 69.0 in | Wt 203.2 lb

## 2022-12-23 DIAGNOSIS — R9389 Abnormal findings on diagnostic imaging of other specified body structures: Secondary | ICD-10-CM

## 2022-12-23 NOTE — Progress Notes (Signed)
Craig [name]    [id number]    [birth year]  Primary Care Physician:Morrow, Clifton Custard, MD  Referring Physician: Farris Has, MD [address] [place],  [place] [address]  Chief complaint:   Being seen for an abnormal CT scan of the chest  HPI:  In today for follow-up of CT Recently had a CT scan September 2024 showing fleeting lung infiltrate Some areas of scarring at the bases of the lung  Continues to feel well generally with no significant complaints  Had a CT in October 2023 and 1 in February 2024 showing multiple nodules that have been fleeting  Denies any significant symptoms Over the last couple of days may be some nasal stuffiness congestion from environmental exposure otherwise has been feeling well Exercises regularly Weight is controlled No significant unusual musculoskeletal pain or discomfort No skin rash  Denies chest pain or chest discomfort 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   diphenhydrAMINE (BENADRYL) 25 MG tablet Take 25 mg by mouth every 6 (six) hours as needed.   HYDROcodone-acetaminophen (NORCO/VICODIN) 5-325 MG tablet Take 1 tablet by mouth every 4 (four) hours as needed for moderate pain.   rivaroxaban (XARELTO) 20 MG TABS tablet Take 1 tablet (20 mg total) by mouth at bedtime.   tamsulosin (FLOMAX) 0.4 MG CAPS capsule Take 0.8 mg by mouth daily.   traMADol (ULTRAM) 50 MG tablet Take 50 mg by mouth 3 (three) times daily as needed (pain).   [DISCONTINUED] trimethoprim (TRIMPEX) 100 MG tablet Take 100 mg by mouth daily. (Patient not taking: Reported on 12/23/2022)   No facility-administered encounter medications on file as of 12/23/2022.    Allergies as of 12/23/2022 - Review Complete 12/23/2022  Allergen Reaction Noted   Gentamycin [gentamicin] Other (See  Comments) 03/23/2018   Nsaids Other (See Comments) 03/23/2018   Other Other (See Comments) 03/23/2018   Vancomycin Rash 03/23/2018    Past Medical History:  Diagnosis Date   Back pain    BPH (benign prostatic hyperplasia)    CAD (coronary artery disease) 2021   Cervical radicular pain    DVT of lower extremity (deep venous thrombosis) (HCC)    Elevated glucose    Family history of adverse reaction to anesthesia    daughter gets nauseated   History of kidney stones    Hyperlipidemia LDL goal <70 12/13/2022   Multiple lung nodules on CT    Prostatitis    Sleep apnea    wears CPAP    Past Surgical History:  Procedure Laterality Date   BACK SURGERY     CERVICAL SPINE SURGERY     EXTRACORPOREAL SHOCK WAVE LITHOTRIPSY Left 12/31/2018   Procedure: EXTRACORPOREAL SHOCK WAVE LITHOTRIPSY (ESWL);  Surgeon: Jerilee Field, MD;  Location: [facility];  Service: Urology;  Laterality: Left;   IR THORACENTESIS ASP PLEURAL SPACE W/IMG GUIDE  04/23/2018   urinary stent Left     Family History  Problem Relation Age of Onset   Colon cancer Mother     Social History   Socioeconomic History   Marital status: Married    Spouse name: Not on file   Number of children: Not on file   Years of education: Not on file   Highest education level: Not on file  Occupational  History   Not on file  Tobacco Use   Smoking status: Never   Smokeless tobacco: Never  Substance and Sexual Activity   Alcohol use: Yes    Comment: occa   Drug use: Never   Sexual activity: Not on file  Other Topics Concern   Not on file  Social History Narrative   Not on file   Social Determinants of Health   Financial Resource Strain: Not on file  Food Insecurity: Not on file  Transportation Needs: Not on file  Physical Activity: Not on file  Stress: Not on file  Social Connections: Not on file  Intimate Partner Violence: Not on file    Review of Systems  Respiratory:  Negative for cough and shortness of  breath.     Vitals:   12/23/22 1436  BP: 122/80  Pulse: 83  Temp: (!) 97.5 F (36.4 C)  SpO2: 97%     Physical Exam Constitutional:      Appearance: Normal appearance.  HENT:     Head: Normocephalic.     Mouth/Throat:     Mouth: Mucous membranes are moist.  Eyes:     General: No scleral icterus. Cardiovascular:     Rate and Rhythm: Normal rate and regular rhythm.     Heart sounds: No murmur heard.    No friction rub.  Pulmonary:     Effort: No respiratory distress.     Breath sounds: No stridor. No wheezing or rhonchi.  Musculoskeletal:     Cervical back: No rigidity or tenderness.  Neurological:     Mental Status: He is alert.  Psychiatric:        Mood and Affect: Mood normal.    Data Reviewed: Previous CT from 2020 reviewed  Multiple CTs completed in the office today including the 1 from 05/10/2022, 12/30/2019, 04/03/2019-fleeting nodularity  Most recent CT from September 2024 reviewed with patient compared with previous  Assessment:  Lung nodules Unlikely to be related to malignant process -Likely inflammatory, less likely infectious etiology  Some previous nodularity have completely resolved  Some scarring at the bases noted  History of obstructive sleep apnea -Wears dental device  Plan/Recommendations: Will repeat CT scan of the chest in a year  Encouraged to call with significant concerns  Order placed for CT a year from now   Virl Diamond MD [facility] 12/23/2022, 3:05 PM  CC: Farris Has, MD

## 2022-12-23 NOTE — Patient Instructions (Addendum)
We will schedule you for CT scan of the chest without contrast in a year to follow-up on the lung nodules  CT order was placed for sometime September 2025  Call us with significant concerns  Follow-up a year from now

## 2023-01-06 DIAGNOSIS — Z23 Encounter for immunization: Secondary | ICD-10-CM | POA: Diagnosis not present

## 2023-01-10 DIAGNOSIS — Z86718 Personal history of other venous thrombosis and embolism: Secondary | ICD-10-CM | POA: Diagnosis not present

## 2023-01-10 DIAGNOSIS — N400 Enlarged prostate without lower urinary tract symptoms: Secondary | ICD-10-CM | POA: Diagnosis not present

## 2023-01-10 DIAGNOSIS — Z01818 Encounter for other preprocedural examination: Secondary | ICD-10-CM | POA: Diagnosis not present

## 2023-01-10 DIAGNOSIS — R7309 Other abnormal glucose: Secondary | ICD-10-CM | POA: Diagnosis not present

## 2023-01-10 DIAGNOSIS — E785 Hyperlipidemia, unspecified: Secondary | ICD-10-CM | POA: Diagnosis not present

## 2023-01-24 DIAGNOSIS — Z79899 Other long term (current) drug therapy: Secondary | ICD-10-CM | POA: Diagnosis not present

## 2023-01-24 DIAGNOSIS — M542 Cervicalgia: Secondary | ICD-10-CM | POA: Diagnosis not present

## 2023-01-24 DIAGNOSIS — N401 Enlarged prostate with lower urinary tract symptoms: Secondary | ICD-10-CM | POA: Diagnosis not present

## 2023-01-24 DIAGNOSIS — M546 Pain in thoracic spine: Secondary | ICD-10-CM | POA: Diagnosis not present

## 2023-01-24 DIAGNOSIS — Z6829 Body mass index (BMI) 29.0-29.9, adult: Secondary | ICD-10-CM | POA: Diagnosis not present

## 2023-01-24 DIAGNOSIS — M545 Low back pain, unspecified: Secondary | ICD-10-CM | POA: Diagnosis not present

## 2023-01-24 DIAGNOSIS — R3912 Poor urinary stream: Secondary | ICD-10-CM | POA: Diagnosis not present

## 2023-01-24 DIAGNOSIS — G894 Chronic pain syndrome: Secondary | ICD-10-CM | POA: Diagnosis not present

## 2023-01-24 DIAGNOSIS — R8271 Bacteriuria: Secondary | ICD-10-CM | POA: Diagnosis not present

## 2023-01-24 DIAGNOSIS — N202 Calculus of kidney with calculus of ureter: Secondary | ICD-10-CM | POA: Diagnosis not present

## 2023-01-31 ENCOUNTER — Encounter (HOSPITAL_BASED_OUTPATIENT_CLINIC_OR_DEPARTMENT_OTHER): Payer: Self-pay | Admitting: Urology

## 2023-01-31 NOTE — Progress Notes (Addendum)
Spoke w/ via phone for pre-op interview---  pt Lab needs dos---- [employer] results------  current EKG in epic/ chart COVID test -----patient states asymptomatic no test needed Arrive at -------  0530 on 02-03-2023 NPO after MN NO Solid Food.  Clear liquids from MN until---  0430 Med rec completed Medications to take morning of surgery ----- none Diabetic medication ----- n/a Patient instructed no nail polish to be worn day of surgery Patient instructed to bring photo id and insurance card day of surgery Patient aware to have Driver (ride ) / caregiver    for 24 hours after surgery - wife, helen Patient Special Instructions ----- reviewed [facility] and visitor guidelines.  Asked to bring oral device dos for overnight that he uses for OSA Pre-Op special Instructions ----- requested and received pt's pcp, Dr Bubba Hales clearance via fax, placed on chart.  Pt stated last dose xarelto 01/30/2023, stated given instructions from office. Patient verbalized understanding of instructions that were given at this phone interview. Patient denies chest pain, sob, fever, cough at the interview.    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

## 2023-02-02 NOTE — H&P (Signed)
Office Visit Report     01/24/2023   --------------------------------------------------------------------------------   Craig [name]  MRN: [id number]  DOB: 1950/11/23, 72 year old Male  SSN:    PRIMARY CARE:  Laurell Josephs, MD  PRIMARY CARE FAX:  [phone]  REFERRING:  Jannifer Hick, MD  PROVIDER:  Jettie Pagan, M.D.  TREATING:  Ulyses Amor, [place]  LOCATION:  [facility] [phone]     --------------------------------------------------------------------------------   CC/HPI: Pt presents today for pre-operative history and physical exam in anticipation of TURP, left laser lithotripsy/ureteroscopy/retrograde pyelogram/stent by Dr. Cardell Peach on 02/03/23. He is doing well and is without complaint.   Pt denies F/C, HA, CP, SOB, N/V, diarrhea/constipation, back pain, flank pain, hematuria, and dysuria.      HX:   Craig [name] is a 72 year old male who is seen in follow with history of gross hematuria, BPH/LUTS and urolithiasis.   #1[name] hematuria: He developed asymptomatic gross hematuria in 11/2021. Negative evaluation other than left ureteral stone as below.  -CT hematuria protocol 12/24/2021 with right UVJ stone measuring 3 mm. No evidence of renal masses or urothelial lesions.  -Cystoscopy 01/25/2022 with no suspicious lesions other than enlarged prostate.  -Urine cytology negative for high-grade urothelial carcinoma.  -Recurrent gross hematuria in 09/2022 that has resolved. CT materia protocol with 4 mm proximal left ureteral stone with no hydronephrosis, multiple additional nonobstructing stones in the left kidney without hydronephrosis. Large prostate approximately 90 cc. No suspicious lesion.  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 He denies a smoking history. He denies a family history of urologic malignancy.   #2. Urolithiasis:  -Most recent CT hematuria protocol 10/2022 with 4 mm proximal left ureteral stone with multiple additional Octocaine left  renal stones. He has not had any recurrent flank pain.   #3. BPH/LUTS:  -BPH evaluation 10/2022 with 93 cc prostate, cystoscopy 12/2021 with bilobar obstructing prostate, UroCuff 10/2022 with Q-Maxx 10 mL/second, Q average 5 mL/second and PVR 274 mL.  -He remains on tamsulosin 0.8 mg daily and has stopped finasteride due to possible side effects of low libido and erectile dysfunction.  -He would like to proceed with TURP.   #4. Chronic prostatitis:  -He has been treated for recurrent prostatitis managed by Dr. Sherron Monday his currently taking trimethoprim 100 mg daily. He has had no recent flares or increase in pain. Discussed drug holiday but he wants to continue.   #5. Prostate cancer screening: Most recent PSA was 3.7 in 06/2022. He denies family history of prostate cancer.     ALLERGIES: Mycin Antibiotics - inner ear damage    MEDICATIONS: Tamsulosin Hcl 0.4 mg capsule 2 capsule PO Daily  Tamsulosin Hcl 0.4 mg capsule 2 capsule  Atorvastatin Calcium 10 mg tablet  Endocet 5 mg-325 mg tablet  Hydrocodone-Acetaminophen 5 mg-325 mg tablet 1 tablet PO Q 6 H PRN  Tramadol Hcl 50 mg tablet  Xarelto 10 mg tablet     GU PSH: Complex cystometrogram, with voiding pressure studies, any technique - 11/22/2022 Complex Uroflow - 11/22/2022 Cystoscopy - 01/25/2022 Emg surf Electrd - 11/22/2022 ESWL, Left - 2020 Locm 300-399Mg /Ml Iodine,1Ml - 11/01/2022, 12/23/2021       PSH Notes: cervical surgery    NON-GU PSH: Lumbar Spine Fusion - 2001 Shoulder Surgery (Unspecified), Right - 09/14/2021 Visit Complexity (formerly GPC1X) - 11/29/2022, 10/18/2022, 09/28/2022, 07/26/2022     GU PMH: BPH w/LUTS - 11/29/2022, - 11/22/2022, - 09/28/2022, - 07/26/2022, - 01/25/2022 Ureteral calculus - 11/29/2022  Weak Urinary Stream - 11/29/2022, - 11/22/2022, - 09/28/2022, - 07/26/2022, - 01/25/2022, - 12/10/2021, - 10/28/2021, - 10/01/2021, - 2019 Nocturia - 11/22/2022, - 07/26/2022, - 10/28/2021, - 10/01/2021, - 2019 Urinary Frequency -  11/22/2022, - 2019 Gross hematuria - 11/01/2022, - 10/18/2022, - 01/25/2022, - 12/23/2021, - 12/10/2021 History of urolithiasis - 10/18/2022 Encounter for Prostate Cancer screening - 09/28/2022, - 07/26/2022 Chronic prostatitis - 07/26/2022, - 01/25/2022 Renal and ureteral calculus, Left - 2020    NON-GU PMH: Solitary pulmonary nodule - 01/05/2022 DVT, History Sleep Apnea    FAMILY HISTORY: 2 sons - Son Colon Cancer - Mother Heart Attack - Mother Kidney Stones - Brother Patient's father is deceased - Father Patient's mother is deceased - Mother stroke - Mother   SOCIAL HISTORY: Marital Status: Married Current Smoking Status: Patient has never smoked.   Tobacco Use Assessment Completed: Used Tobacco in last 30 days? Does not use smokeless tobacco. Drinks 3 drinks per week.  Does not use drugs. Drinks 3 caffeinated drinks per day. Has not had a blood transfusion.     Notes: ETOH wine, rum once per week    REVIEW OF SYSTEMS:    GU Review Male:   Patient denies frequent urination, hard to postpone urination, burning/ pain with urination, get up at night to urinate, leakage of urine, stream starts and stops, trouble starting your stream, have to strain to urinate , erection problems, and penile pain.  Gastrointestinal (Upper):   Patient denies nausea, vomiting, and indigestion/ heartburn.  Gastrointestinal (Lower):   Patient denies diarrhea and constipation.  Constitutional:   Patient denies fever, weight loss, night sweats, and fatigue.  Skin:   Patient denies skin rash/ lesion and itching.  Eyes:   Patient denies blurred vision and double vision.  Ears/ Nose/ Throat:   Patient denies sore throat and sinus problems.  Hematologic/Lymphatic:   Patient denies swollen glands and easy bruising.  Cardiovascular:   Patient denies leg swelling and chest pains.  Respiratory:   Patient denies cough and shortness of breath.  Endocrine:   Patient denies excessive thirst.  Musculoskeletal:   Patient  denies back pain and joint pain.  Neurological:   Patient denies headaches and dizziness.  Psychologic:   Patient denies depression and anxiety.   VITAL SIGNS:      01/24/2023 12:56 PM  BP 156/86 mmHg  Pulse 80 /min  Temperature 97.7 F / 36.5 C   MULTI-SYSTEM PHYSICAL EXAMINATION:    Constitutional: Well-nourished. No physical deformities. Normally developed. Good grooming.  Neck: Neck symmetrical, not swollen. Normal tracheal position.  Respiratory: Normal breath sounds. No labored breathing, no use of accessory muscles.   Cardiovascular: Regular rate and rhythm. No murmur, no gallop.   Lymphatic: No enlargement of neck, axillae, groin.  Skin: No paleness, no jaundice, no cyanosis. No lesion, no ulcer, no rash.  Neurologic / Psychiatric: Oriented to time, oriented to place, oriented to person. No depression, no anxiety, no agitation.  Gastrointestinal: No mass, no tenderness, no rigidity, non obese abdomen.  Eyes: Normal conjunctivae. Normal eyelids.  Ears, Nose, Mouth, and Throat: Left ear no scars, no lesions, no masses. Right ear no scars, no lesions, no masses. Nose no scars, no lesions, no masses. Normal hearing. Normal lips.  Musculoskeletal: Normal gait and station of head and neck.     Complexity of Data:  Records Review:   Previous Patient Records  Urine Test Review:   Urinalysis   01/24/23  Urinalysis  Urine Appearance Clear   Urine  Color Yellow   Urine Glucose Trace mg/dL  Urine Bilirubin Neg mg/dL  Urine Ketones Neg mg/dL  Urine Specific Gravity 1.020   Urine Blood Trace ery/uL  Urine pH 5.5   Urine Protein Neg mg/dL  Urine Urobilinogen 0.2 mg/dL  Urine Nitrites Neg   Urine Leukocyte Esterase Neg leu/uL  Urine WBC/hpf 0 - 5/hpf   Urine RBC/hpf 0 - 2/hpf   Urine Epithelial Cells NS (Not Seen)   Urine Bacteria Rare (0-9/hpf)   Urine Mucous Not Present   Urine Yeast NS (Not Seen)   Urine Trichomonas Not Present   Urine Cystals NS (Not Seen)   Urine Casts NS  (Not Seen)   Urine Sperm Not Present    PROCEDURES:          Urinalysis w/Scope - 81001 Dipstick Dipstick Cont'd Micro  Color: Yellow Bilirubin: Neg mg/dL WBC/hpf: 0 - 5/hpf  Appearance: Clear Ketones: Neg mg/dL RBC/hpf: 0 - 2/hpf  Specific Gravity: 1.020 Blood: Trace ery/uL Bacteria: Rare (0-9/hpf)  pH: 5.5 Protein: Neg mg/dL Cystals: NS (Not Seen)  Glucose: Trace mg/dL Urobilinogen: 0.2 mg/dL Casts: NS (Not Seen)    Nitrites: Neg Trichomonas: Not Present    Leukocyte Esterase: Neg leu/uL Mucous: Not Present      Epithelial Cells: NS (Not Seen)      Yeast: NS (Not Seen)      Sperm: Not Present    ASSESSMENT:      ICD-10 Details  1 GU:   BPH w/LUTS - N40.1   2   Renal and ureteral calculus - N20.2 Left   PLAN:           Orders Labs Urine Culture          Schedule Return Visit/Planned Activity: Keep Scheduled Appointment - Schedule Surgery          Document Letter(s):  Created for Patient: Clinical Summary         Notes:   There are no changes in the patients history or physical exam since last evaluation by Dr. Cardell Peach. Pt is scheduled to undergo TURP, left laser litho/ureteroscopy/retrograde pyelogram/stent on 02/03/23.   Urine culture to r/o infection.   All pt's questions were answered to the best of my ability.    Urology Preoperative H&P   Chief Complaint: Left ureteral stone, BPH  History of Present Illness: Craig [name] is a 72 y.o. male with a left ureteral stone and BPH here for ureteroscopy with laser lithotripsy and TURP. He has not passed his stone. He denies fevers, chills, dysuria, flank pain.  Past Medical History:  Diagnosis Date   Anticoagulant long-term use    xarelto-- managed by pcp   BPH associated with nocturia    CAD (coronary artery disease) 2021   cardiologist--- dr t. turner;   CCTA 12-30-2019  score 158;  mild and  nonobstructive involving RCA/ LAD/ RI   Cervical radicular pain    DDD (degenerative disc disease), thoracolumbar    Elevated  glucose    Family history of adverse reaction to anesthesia    daughter--- ponv   Hematuria    History of DVT of lower extremity    per pt LLE  2007 and recurrent 2016  LLE  (01-31-2023  when asked pt does not remember every being work-up for blood clotting disorders)   History of kidney stones    History of prostatitis    Hyperlipidemia LDL goal <70 12/13/2022   Multiple lung nodules on CT    pulmonolgy--- dr  olalere;  stated not malig , due to inflammatory process   Orthostatic intolerance    OSA (obstructive sleep apnea)    01-31-2023 per pt using oral device   Vestibular dysfunction of both ears    due to anttiviotic , gentamycin   Wears glasses    Wears hearing aid in both ears     Past Surgical History:  Procedure Laterality Date   ANTERIOR CERVICAL DECOMP/DISCECTOMY FUSION  2010   in [place];  C5--6   CYSTOSCOPY W/ URETERAL STENT PLACEMENT     approx 2005   EXTRACORPOREAL SHOCK WAVE LITHOTRIPSY Left 12/31/2018   Procedure: EXTRACORPOREAL SHOCK WAVE LITHOTRIPSY (ESWL);  Surgeon: Jerilee Field, MD;  Location: [facility];  Service: Urology;  Laterality: Left;   IR THORACENTESIS ASP PLEURAL SPACE W/IMG GUIDE  04/23/2018   LUMBAR DISC SURGERY  2006   L5--S1   LUMBAR FUSION  02/1999   L4--5   SHOULDER ARTHROSCOPY WITH ROTATOR CUFF REPAIR Right 09/14/2021   @[facility]  by dr Ave Filter    Allergies:  Allergies  Allergen Reactions   Gentamycin [Gentamicin] Other (See Comments)    Damaged L inner ear   Nsaids Other (See Comments)    Pt avoids , is on Xarelto   Vancomycin Rash    Red man syndrome    Family History  Problem Relation Age of Onset   Colon cancer Mother     Social History:  reports that he has never smoked. He has never used smokeless tobacco. He reports current alcohol use of about 3.0 standard drinks of alcohol per week. He reports that he does not use drugs.  ROS: A complete review of systems was performed.  All systems are negative except for pertinent  findings as noted.  Physical Exam:  Vital signs in last 24 hours:   Constitutional:  Alert and oriented, No acute distress Cardiovascular: Regular rate and rhythm Respiratory: Normal respiratory effort, Lungs clear bilaterally GI: Abdomen is soft, nontender, nondistended, no abdominal masses GU: No CVA tenderness Lymphatic: No lymphadenopathy Neurologic: Grossly intact, no focal deficits Psychiatric: Normal mood and affect  Laboratory Data:  No results for input(s): "WBC", "HGB", "HCT", "PLT" in the last 72 hours.  No results for input(s): "NA", "K", "CL", "GLUCOSE", "BUN", "CALCIUM", "CREATININE" in the last 72 hours.  Invalid input(s): "CO3"   No results found for this or any previous visit (from the past 24 hour(s)). No results found for this or any previous visit (from the past 240 hour(s)).  Renal Function: No results for input(s): "CREATININE" in the last 168 hours. CrCl cannot be calculated (Patient's most recent lab result is older than the maximum 21 days allowed.).  Radiologic Imaging: No results found.  I independently reviewed the above imaging studies.  Assessment and Plan Craig [name] is a 72 y.o. male with a left ureteral stone and BPH here for ureteroscopy with laser lithotripsy and TURP.   Risks and benefits of Transurethral Resection of the Prostate were reviewed in detail including infection, bleeding, blood transfusion, injury to bladder/urethra/surrounding structures, erectile dysfunction, urinary incontinence, bladder neck contracture, persistent obstructive and irritative voiding symptoms, and global anesthesia risks including but not limited to CVA, MI, DVT, PE, pneumonia, and death. He expressed understanding and desire to proceed.   -The risks, benefits and alternatives of cystoscopy with ureteroscopy with JJ stent placement was discussed with the patient.  Risks include, but are not limited to: bleeding, urinary tract infection, ureteral injury, ureteral  stricture disease, chronic pain, urinary symptoms, bladder  injury, stent migration, the need for nephrostomy tube placement, MI, CVA, DVT, PE and the inherent risks with general anesthesia.  The patient voices understanding and wishes to proceed.   Matt R. Jalani Rominger MD 02/02/2023, 4:31 PM  [facility] Pager: [phone]): [phone]

## 2023-02-02 NOTE — Anesthesia Preprocedure Evaluation (Addendum)
Anesthesia Evaluation  Patient identified by MRN, date of birth, ID band Patient awake    Reviewed: Allergy & Precautions, NPO status , Patient's Chart, lab work & pertinent test results  History of Anesthesia Complications Negative for: history of anesthetic complications  Airway Mallampati: I  TM Distance: >3 FB Neck ROM: Full    Dental  (+) Dental Advisory Given   Pulmonary sleep apnea (does not use CPAP)    breath sounds clear to auscultation       Cardiovascular (-) hypertension(-) angina + CAD (non-obstructive) and + DVT   Rhythm:Regular Rate:Normal  '21 ECHO: EF 60-65%, mild LVH, normal LVF, Grade 1 DD, normal RVF, no significant valvular abnormalities   Neuro/Psych    GI/Hepatic negative GI ROS, Neg liver ROS,,,  Endo/Other  negative endocrine ROS    Renal/GU H/o stones     Musculoskeletal   Abdominal   Peds  Hematology xarelto   Anesthesia Other Findings   Reproductive/Obstetrics                             Anesthesia Physical Anesthesia Plan  ASA: 3  Anesthesia Plan: General   Post-op Pain Management: Tylenol PO (pre-op)*   Induction: Intravenous  PONV Risk Score and Plan: 2 and Ondansetron and Dexamethasone  Airway Management Planned: LMA  Additional Equipment: None  Intra-op Plan:   Post-operative Plan: Extubation in OR  Informed Consent: I have reviewed the patients History and Physical, chart, labs and discussed the procedure including the risks, benefits and alternatives for the proposed anesthesia with the patient or authorized representative who has indicated his/her understanding and acceptance.     Dental advisory given  Plan Discussed with: CRNA and Surgeon  Anesthesia Plan Comments:         Anesthesia Quick Evaluation

## 2023-02-03 ENCOUNTER — Ambulatory Visit (HOSPITAL_BASED_OUTPATIENT_CLINIC_OR_DEPARTMENT_OTHER): Payer: Medicare Other | Admitting: Anesthesiology

## 2023-02-03 ENCOUNTER — Ambulatory Visit (HOSPITAL_BASED_OUTPATIENT_CLINIC_OR_DEPARTMENT_OTHER)
Admission: RE | Admit: 2023-02-03 | Discharge: 2023-02-04 | Disposition: A | Discharge status: Home / Self Care | Payer: Medicare Other | Attending: Urology | Admitting: Urology

## 2023-02-03 ENCOUNTER — Encounter (HOSPITAL_BASED_OUTPATIENT_CLINIC_OR_DEPARTMENT_OTHER)
Admission: RE | Disposition: A | Discharge status: Home / Self Care | Payer: Self-pay | Source: Home / Self Care | Attending: Urology

## 2023-02-03 ENCOUNTER — Encounter (HOSPITAL_BASED_OUTPATIENT_CLINIC_OR_DEPARTMENT_OTHER): Payer: Self-pay | Admitting: Urology

## 2023-02-03 ENCOUNTER — Other Ambulatory Visit: Payer: Self-pay

## 2023-02-03 DIAGNOSIS — N201 Calculus of ureter: Secondary | ICD-10-CM | POA: Diagnosis not present

## 2023-02-03 DIAGNOSIS — C61 Malignant neoplasm of prostate: Secondary | ICD-10-CM | POA: Insufficient documentation

## 2023-02-03 DIAGNOSIS — N401 Enlarged prostate with lower urinary tract symptoms: Secondary | ICD-10-CM | POA: Insufficient documentation

## 2023-02-03 DIAGNOSIS — Z87442 Personal history of urinary calculi: Secondary | ICD-10-CM | POA: Diagnosis not present

## 2023-02-03 DIAGNOSIS — N410 Acute prostatitis: Secondary | ICD-10-CM | POA: Diagnosis not present

## 2023-02-03 DIAGNOSIS — I2510 Atherosclerotic heart disease of native coronary artery without angina pectoris: Secondary | ICD-10-CM | POA: Diagnosis not present

## 2023-02-03 DIAGNOSIS — G4733 Obstructive sleep apnea (adult) (pediatric): Secondary | ICD-10-CM | POA: Insufficient documentation

## 2023-02-03 DIAGNOSIS — N411 Chronic prostatitis: Secondary | ICD-10-CM | POA: Diagnosis not present

## 2023-02-03 DIAGNOSIS — Z86718 Personal history of other venous thrombosis and embolism: Secondary | ICD-10-CM | POA: Insufficient documentation

## 2023-02-03 DIAGNOSIS — R3912 Poor urinary stream: Secondary | ICD-10-CM | POA: Diagnosis not present

## 2023-02-03 DIAGNOSIS — Z01818 Encounter for other preprocedural examination: Secondary | ICD-10-CM

## 2023-02-03 DIAGNOSIS — N320 Bladder-neck obstruction: Secondary | ICD-10-CM | POA: Diagnosis not present

## 2023-02-03 DIAGNOSIS — N400 Enlarged prostate without lower urinary tract symptoms: Secondary | ICD-10-CM | POA: Diagnosis present

## 2023-02-03 DIAGNOSIS — N210 Calculus in bladder: Secondary | ICD-10-CM | POA: Diagnosis not present

## 2023-02-03 DIAGNOSIS — N138 Other obstructive and reflux uropathy: Secondary | ICD-10-CM | POA: Insufficient documentation

## 2023-02-03 HISTORY — DX: Long term (current) use of anticoagulants: Z79.01

## 2023-02-03 HISTORY — DX: Personal history of other diseases of male genital organs: Z87.438

## 2023-02-03 HISTORY — DX: Hematuria, unspecified: R31.9

## 2023-02-03 HISTORY — DX: Other intervertebral disc degeneration, thoracolumbar region: M51.35

## 2023-02-03 HISTORY — DX: Benign prostatic hyperplasia with lower urinary tract symptoms: N40.1

## 2023-02-03 HISTORY — PX: TRANSURETHRAL RESECTION OF PROSTATE: SHX73

## 2023-02-03 HISTORY — DX: Unspecified disorder of vestibular function, bilateral: H81.93

## 2023-02-03 HISTORY — DX: Presence of external hearing-aid: Z97.4

## 2023-02-03 HISTORY — DX: Orthostatic hypotension: I95.1

## 2023-02-03 HISTORY — DX: Personal history of other venous thrombosis and embolism: Z86.718

## 2023-02-03 HISTORY — PX: CYSTOSCOPY/URETEROSCOPY/HOLMIUM LASER/STENT PLACEMENT: SHX6546

## 2023-02-03 HISTORY — DX: Presence of spectacles and contact lenses: Z97.3

## 2023-02-03 HISTORY — DX: Obstructive sleep apnea (adult) (pediatric): G47.33

## 2023-02-03 LAB — POCT I-STAT, CHEM 8
BUN: 19 mg/dL (ref 8–23)
Calcium, Ion: 1.28 mmol/L (ref 1.15–1.40)
Chloride: 104 mmol/L (ref 98–111)
Creatinine, Ser: 0.9 mg/dL (ref 0.61–1.24)
Glucose, Bld: 104 mg/dL — ABNORMAL HIGH (ref 70–99)
HCT: 45 % (ref 39.0–52.0)
Hemoglobin: 15.3 g/dL (ref 13.0–17.0)
Potassium: 4 mmol/L (ref 3.5–5.1)
Sodium: 140 mmol/L (ref 135–145)
TCO2: 23 mmol/L (ref 22–32)

## 2023-02-03 SURGERY — TURP (TRANSURETHRAL RESECTION OF PROSTATE)
Anesthesia: General

## 2023-02-03 MED ORDER — OXYCODONE HCL 5 MG PO TABS: 5.0000 mg | ORAL_TABLET | Freq: Once | ORAL | Status: DC | PRN

## 2023-02-03 MED ORDER — DIPHENHYDRAMINE HCL 25 MG PO CAPS: 25.0000 mg | ORAL_CAPSULE | Freq: Four times a day (QID) | ORAL | Status: DC | PRN

## 2023-02-03 MED ORDER — IOHEXOL 300 MG/ML  SOLN
INTRAMUSCULAR | Status: DC | PRN
  Administered 2023-02-03: 4 mL via URETHRAL

## 2023-02-03 MED ORDER — EPHEDRINE 5 MG/ML INJ
INTRAVENOUS | Status: AC
  Filled 2023-02-03: qty 5

## 2023-02-03 MED ORDER — BISACODYL 10 MG RE SUPP: 10.0000 mg | Freq: Every day | RECTAL | Status: DC | PRN

## 2023-02-03 MED ORDER — CEFAZOLIN SODIUM-DEXTROSE 2-4 GM/100ML-% IV SOLN
2.0000 g | INTRAVENOUS | Status: AC
  Administered 2023-02-03: 2 g via INTRAVENOUS

## 2023-02-03 MED ORDER — LACTATED RINGERS IV SOLN: INTRAVENOUS | Status: DC

## 2023-02-03 MED ORDER — TAMSULOSIN HCL 0.4 MG PO CAPS: 0.8000 mg | ORAL_CAPSULE | Freq: Every day | ORAL | Status: DC

## 2023-02-03 MED ORDER — PROPOFOL 10 MG/ML IV BOLUS
INTRAVENOUS | Status: DC | PRN
  Administered 2023-02-03: 50 mg via INTRAVENOUS
  Administered 2023-02-03: 150 mg via INTRAVENOUS

## 2023-02-03 MED ORDER — TAMSULOSIN HCL 0.4 MG PO CAPS
ORAL_CAPSULE | ORAL | Status: AC
  Filled 2023-02-03: qty 2

## 2023-02-03 MED ORDER — MIDAZOLAM HCL 2 MG/2ML IJ SOLN
0.5000 mg | Freq: Once | INTRAMUSCULAR | Status: DC | PRN
Start: 2023-02-03 — End: 2023-02-04

## 2023-02-03 MED ORDER — ACETAMINOPHEN 325 MG PO TABS: 650.0000 mg | ORAL_TABLET | ORAL | Status: DC | PRN

## 2023-02-03 MED ORDER — ONDANSETRON HCL 4 MG/2ML IJ SOLN: 4.0000 mg | INTRAMUSCULAR | Status: DC | PRN

## 2023-02-03 MED ORDER — PROPOFOL 10 MG/ML IV BOLUS
INTRAVENOUS | Status: AC
  Filled 2023-02-03: qty 20

## 2023-02-03 MED ORDER — SODIUM CHLORIDE 0.9 % IR SOLN
3000.0000 mL | Status: DC
  Administered 2023-02-03: 3000 mL

## 2023-02-03 MED ORDER — ARTIFICIAL TEARS OPHTHALMIC OINT
TOPICAL_OINTMENT | OPHTHALMIC | Status: AC
  Filled 2023-02-03: qty 3.5

## 2023-02-03 MED ORDER — SODIUM CHLORIDE 0.9% FLUSH: 3.0000 mL | Freq: Two times a day (BID) | INTRAVENOUS | Status: DC

## 2023-02-03 MED ORDER — SODIUM CHLORIDE 0.9 % IR SOLN
Status: DC | PRN
  Administered 2023-02-03: 24000 mL via INTRAVESICAL
  Administered 2023-02-03: 6000 mL via INTRAVESICAL
  Administered 2023-02-03: 3000 mL via INTRAVESICAL

## 2023-02-03 MED ORDER — DOCUSATE SODIUM 100 MG PO CAPS
100.0000 mg | ORAL_CAPSULE | Freq: Two times a day (BID) | ORAL | Status: DC
  Administered 2023-02-03 (×2): 100 mg via ORAL

## 2023-02-03 MED ORDER — HYDROMORPHONE HCL 1 MG/ML IJ SOLN: 0.2500 mg | INTRAMUSCULAR | Status: DC | PRN

## 2023-02-03 MED ORDER — OXYCODONE HCL 5 MG/5ML PO SOLN: 5.0000 mg | Freq: Once | ORAL | Status: DC | PRN

## 2023-02-03 MED ORDER — PHENYLEPHRINE 80 MCG/ML (10ML) SYRINGE FOR IV PUSH (FOR BLOOD PRESSURE SUPPORT)
PREFILLED_SYRINGE | INTRAVENOUS | Status: DC | PRN
  Administered 2023-02-03: 80 ug via INTRAVENOUS
  Administered 2023-02-03: 160 ug via INTRAVENOUS
  Administered 2023-02-03 (×2): 80 ug via INTRAVENOUS

## 2023-02-03 MED ORDER — DIPHENHYDRAMINE HCL 12.5 MG/5ML PO ELIX: 12.5000 mg | ORAL_SOLUTION | Freq: Four times a day (QID) | ORAL | Status: DC | PRN

## 2023-02-03 MED ORDER — EPHEDRINE SULFATE-NACL 50-0.9 MG/10ML-% IV SOSY
PREFILLED_SYRINGE | INTRAVENOUS | Status: DC | PRN
  Administered 2023-02-03 (×2): 5 mg via INTRAVENOUS

## 2023-02-03 MED ORDER — DOCUSATE SODIUM 100 MG PO CAPS
ORAL_CAPSULE | ORAL | Status: AC
  Filled 2023-02-03: qty 1

## 2023-02-03 MED ORDER — OXYBUTYNIN CHLORIDE ER 10 MG PO TB24
10.0000 mg | ORAL_TABLET | Freq: Every day | ORAL | Status: DC
  Administered 2023-02-03: 10 mg via ORAL

## 2023-02-03 MED ORDER — STERILE WATER FOR IRRIGATION IR SOLN
Status: DC | PRN
  Administered 2023-02-03: 500 mL

## 2023-02-03 MED ORDER — DIPHENHYDRAMINE HCL 50 MG/ML IJ SOLN: 12.5000 mg | Freq: Four times a day (QID) | INTRAMUSCULAR | Status: DC | PRN

## 2023-02-03 MED ORDER — CEFAZOLIN SODIUM-DEXTROSE 2-4 GM/100ML-% IV SOLN
INTRAVENOUS | Status: AC
  Filled 2023-02-03: qty 100

## 2023-02-03 MED ORDER — PHENYLEPHRINE 80 MCG/ML (10ML) SYRINGE FOR IV PUSH (FOR BLOOD PRESSURE SUPPORT)
PREFILLED_SYRINGE | INTRAVENOUS | Status: AC
  Filled 2023-02-03: qty 10

## 2023-02-03 MED ORDER — SODIUM CHLORIDE 0.9 % IV SOLN: INTRAVENOUS | Status: AC

## 2023-02-03 MED ORDER — FENTANYL CITRATE (PF) 100 MCG/2ML IJ SOLN
INTRAMUSCULAR | Status: AC
  Filled 2023-02-03: qty 2

## 2023-02-03 MED ORDER — CYCLOBENZAPRINE HCL 5 MG PO TABS
5.0000 mg | ORAL_TABLET | Freq: Three times a day (TID) | ORAL | Status: DC | PRN
  Filled 2023-02-03: qty 1

## 2023-02-03 MED ORDER — FENTANYL CITRATE (PF) 100 MCG/2ML IJ SOLN
INTRAMUSCULAR | Status: DC | PRN
  Administered 2023-02-03: 100 ug via INTRAVENOUS

## 2023-02-03 MED ORDER — TRAMADOL HCL 50 MG PO TABS: 50.0000 mg | ORAL_TABLET | Freq: Three times a day (TID) | ORAL | Status: DC | PRN

## 2023-02-03 MED ORDER — POLYETHYLENE GLYCOL 3350 17 G PO PACK: 17.0000 g | PACK | Freq: Every day | ORAL | Status: DC | PRN

## 2023-02-03 MED ORDER — SODIUM CHLORIDE 0.9 % IV SOLN: 250.0000 mL | INTRAVENOUS | Status: DC | PRN

## 2023-02-03 MED ORDER — HYDROMORPHONE HCL 1 MG/ML IJ SOLN: 0.5000 mg | INTRAMUSCULAR | Status: DC | PRN

## 2023-02-03 MED ORDER — SODIUM CHLORIDE 0.9% FLUSH: 3.0000 mL | INTRAVENOUS | Status: DC | PRN

## 2023-02-03 MED ORDER — ACETAMINOPHEN 500 MG PO TABS
1000.0000 mg | ORAL_TABLET | Freq: Once | ORAL | Status: AC
  Administered 2023-02-03: 1000 mg via ORAL

## 2023-02-03 MED ORDER — ONDANSETRON HCL 4 MG/2ML IJ SOLN
INTRAMUSCULAR | Status: DC | PRN
  Administered 2023-02-03: 4 mg via INTRAVENOUS

## 2023-02-03 MED ORDER — ZOLPIDEM TARTRATE 5 MG PO TABS: 5.0000 mg | ORAL_TABLET | Freq: Every evening | ORAL | Status: DC | PRN

## 2023-02-03 MED ORDER — ATORVASTATIN CALCIUM 10 MG PO TABS
10.0000 mg | ORAL_TABLET | Freq: Every day | ORAL | Status: DC
Start: 2023-02-03 — End: 2023-02-04
  Filled 2023-02-03: qty 1

## 2023-02-03 MED ORDER — ACETAMINOPHEN 500 MG PO TABS
ORAL_TABLET | ORAL | Status: AC
  Filled 2023-02-03: qty 2

## 2023-02-03 MED ORDER — LIDOCAINE 2% (20 MG/ML) 5 ML SYRINGE
INTRAMUSCULAR | Status: DC | PRN
  Administered 2023-02-03: 40 mg via INTRAVENOUS

## 2023-02-03 MED ORDER — DEXAMETHASONE SODIUM PHOSPHATE 10 MG/ML IJ SOLN
INTRAMUSCULAR | Status: DC | PRN
  Administered 2023-02-03: 5 mg via INTRAVENOUS

## 2023-02-03 MED ORDER — OXYBUTYNIN CHLORIDE ER 10 MG PO TB24
ORAL_TABLET | ORAL | Status: AC
  Filled 2023-02-03: qty 1

## 2023-02-03 MED ORDER — ONDANSETRON HCL 4 MG/2ML IJ SOLN
INTRAMUSCULAR | Status: AC
  Filled 2023-02-03: qty 2

## 2023-02-03 MED ORDER — DEXAMETHASONE SODIUM PHOSPHATE 10 MG/ML IJ SOLN
INTRAMUSCULAR | Status: AC
  Filled 2023-02-03: qty 1

## 2023-02-03 SURGICAL SUPPLY — 40 items
BAG DRAIN URO-CYSTO SKYTR STRL (DRAIN) ×2 IMPLANT
BAG DRN RND TRDRP ANRFLXCHMBR (UROLOGICAL SUPPLIES) ×2
BAG DRN UROCATH (DRAIN) ×2
BAG URINE DRAIN 2000ML AR STRL (UROLOGICAL SUPPLIES) ×2 IMPLANT
BAG URINE LEG 500ML (DRAIN) IMPLANT
BASKET ZERO TIP NITINOL 2.4FR (BASKET) IMPLANT
BSKT STON RTRVL ZERO TP 2.4FR (BASKET)
CATH FOLEY 2WAY SLVR 30CC 20FR (CATHETERS) IMPLANT
CATH FOLEY 3WAY 30CC 22FR (CATHETERS) ×2 IMPLANT
CATH SET URETHRAL DILATOR (CATHETERS) IMPLANT
CATH URETL OPEN 5X70 (CATHETERS) ×2 IMPLANT
CLOTH BEACON ORANGE TIMEOUT ST (SAFETY) ×2 IMPLANT
ELECT BIVAP BIPO 22/24 DONUT (ELECTROSURGICAL)
ELECT REM PT RETURN 9FT ADLT (ELECTROSURGICAL)
ELECTRD BIVAP BIPO 22/24 DONUT (ELECTROSURGICAL) IMPLANT
ELECTRODE REM PT RTRN 9FT ADLT (ELECTROSURGICAL) IMPLANT
GLOVE BIO SURGEON STRL SZ7 (GLOVE) ×2 IMPLANT
GLOVE BIOGEL PI IND STRL 6 (GLOVE) IMPLANT
GLOVE BIOGEL PI IND STRL 7.0 (GLOVE) IMPLANT
GOWN STRL REUS W/TWL LRG LVL3 (GOWN DISPOSABLE) ×2 IMPLANT
GUIDEWIRE STR DUAL SENSOR (WIRE) ×2 IMPLANT
GUIDEWIRE ZIPWRE .038 STRAIGHT (WIRE) IMPLANT
HOLDER FOLEY CATH W/STRAP (MISCELLANEOUS) ×2 IMPLANT
IV NS IRRIG 3000ML ARTHROMATIC (IV SOLUTION) ×4 IMPLANT
KIT TURNOVER CYSTO (KITS) ×2 IMPLANT
LASER FIB FLEXIVA PULSE ID 365 (Laser) IMPLANT
LOOP CUT BIPOLAR 24F LRG (ELECTROSURGICAL) ×2 IMPLANT
MANIFOLD NEPTUNE II (INSTRUMENTS) ×2 IMPLANT
NS IRRIG 500ML POUR BTL (IV SOLUTION) ×2 IMPLANT
PACK CYSTO (CUSTOM PROCEDURE TRAY) ×2 IMPLANT
SLEEVE SCD COMPRESS KNEE MED (STOCKING) ×2 IMPLANT
SYR 10ML LL (SYRINGE) ×2 IMPLANT
SYR 30ML LL (SYRINGE) ×2 IMPLANT
SYR TOOMEY IRRIG 70ML (MISCELLANEOUS) ×2
SYRINGE TOOMEY IRRIG 70ML (MISCELLANEOUS) ×2 IMPLANT
TRACTIP FLEXIVA PULS ID 200XHI (Laser) IMPLANT
TRACTIP FLEXIVA PULSE ID 200 (Laser)
TUBE CONNECTING 12X1/4 (SUCTIONS) ×2 IMPLANT
TUBING UROLOGY SET (TUBING) ×2 IMPLANT
WATER STERILE IRR 500ML POUR (IV SOLUTION) IMPLANT

## 2023-02-03 NOTE — Anesthesia Procedure Notes (Signed)
Procedure Name: LMA Insertion Date/Time: 02/03/2023 7:40 AM  Performed by: Bishop Limbo, CRNAPre-anesthesia Checklist: Patient identified, Emergency Drugs available, Suction available and Patient being monitored Patient Re-evaluated:Patient Re-evaluated prior to induction Oxygen Delivery Method: Circle System Utilized Preoxygenation: Pre-oxygenation with 100% oxygen Induction Type: IV induction Ventilation: Mask ventilation without difficulty LMA: LMA inserted LMA Size: 5.0 Number of attempts: 1 Placement Confirmation: positive ETCO2 Tube secured with: Tape Dental Injury: Teeth and Oropharynx as per pre-operative assessment

## 2023-02-03 NOTE — Op Note (Signed)
Operative Note  Preoperative diagnosis:  1.  BPH with bladder outlet obstruction 2. Left ureteral stone  Postoperative diagnosis: 1.  BPH with bladder outlet obstruction 2. Bladder stone  Procedure(s): 1.  Bipolar transurethral resection of prostate 2. Diagnostic left ureteroscopy 3. Left retrograde pyelogram  Surgeon: Jettie Pagan, MD  Assistants:  None  Anesthesia:  General  Complications:  None  EBL:  Minimal  Specimens: 1. Prostate chips ID Type Source Tests Collected by Time Destination  1 : Prostate chips Tissue PATH Soft tissue SURGICAL PATHOLOGY Jannifer Hick, MD 02/03/2023 [phone]    Drains/Catheters: 1.  22Fr 3 way catheter with 30ml water into balloon  Intraoperative findings:   Approximately 5 mm stone seen within the bladder. Left diagnostic ureteroscopy with no evidence of ureteral stone. Left retrograde pyelogram with no hydronephrosis TURP completed with excellent wide open prostatic fossa with excellent hemostasis.  Indication:  Craig [name] is a 71 y.o. male with BPH with bladder outlet obstruction presenting for transurethral resection of the prostate. He had a TRUS volume of 90. Uroflow demonstrated average flow 5 mL/sec. with PVR 274 mL. Preop cystoscopy demonstrated bilobar obstructing prostate. He has failed alpha blocker and keep 5 alpha reductase inhibitors.  After thorough discussion including all relevant risk benefits and alternatives, he presents today for a bipolar TURP.  Description of procedure: The indication, alternatives, benefits and risks were discussed with the patient and informed consent was obtained.  Patient was brought to the operating room table, positioned supine, secured with a safety strap.  Pneumatic compression devices were placed on the lower extremities.  After the administration of intravenous antibiotics and general anesthesia, the patient was repositioned into the dorsal lithotomy position.  All pressure points were carefully  padded.  A rectal examination was performed confirming a smooth symmetric enlarged gland.  The genitalia were prepped and draped in standard sterile manner.  A timeout was completed, verifying the correct patient, surgical procedure and positioning prior to beginning the procedure.  Isotonic sodium chloride was used for irrigation.  He has some slight medial narrowing and thus his urethra was dilated with [employer] sounds from 68 [place] to 28 [place].  Next, a 53 French rigid cystoscope was advanced into the bladder and we encountered a bilobar obstructing prostate.  Surveillance of bladder demonstrated approximately 5 mm stone within the bladder.  There were no suspicious bladder lesions.  Next, 0.038 sensor wire was passed up to the level of the kidney.  This was secured to the drape as a safety wire.  Next, a semirigid ureteroscope was advanced into the left ureter and advanced into the renal pelvis were identified no further ureteral stones.  I then performed left retrograde pyelogram after passing a 5 [place] open-ended catheter into the kidney which revealed no hydronephrosis.  The contrast drained promptly.  As such, the wire was then removed.  A 26 French continuous-flow resectoscope sheath with the visual obturator and a 30 degree lens was advanced under direct vision into the bladder.  The anterior urethra appeared normal in its entirety.The prostatic urethra was elongated with bilobar hyperplasia.  On cystoscopic evaluation, his bladder capacity appeared normal, the bladder wall was noted to expand symmetrically in all dimensions.  There were no tumors, stones or foreign bodies present. The bladder was trabeculated with normal-appearing mucosa.  Both ureteral orifices were in their normal anatomic positions with clear urinary reflux noted bilaterally.  The obturator was removed and replaced by the working element with a resection loop.  The location of the ureteral orifices and the prostatic  configuration were again confirmed.  Starting at the bladder neck and proceeding distally to the verumontanum a transurethral section of the prostate was performed using bipolar using energy of 4 and 5 for cutting and coagulation, respectively.  The procedure began at the bladder neck at the 5 o'clock and 7 o'clock positions and carefully carried distally to the verumontanum, resecting the intervening prostatic adenoma.  Next the left lateral lobe was resected to the level of the transverse capsular fibers.  The identical procedure was performed on the right lobe.  Attention was then directed anteriorly and the resection was completed from the 10 o'clock to 2 o'clock positions.  All bleeding vessels were fulgurated achieving meticulous hemostasis.  The bladder was irrigated with a Toomey syringe, ensuring removal of all prostate chips which were sent to pathology for evaluation.  Having completed the resection and the chips removed, we again confirmed hemostasis with the loop with coagulating current.  Upon completion of the entire procedure, the bladder and posterior urethra were reexamined, confirming open prostatic urethra and bladder neck without evidence of bleeding or perforation.  Both ureteral orifices and the external sphincter were noted to be intact.  The resectoscope was withdrawn under direct vision and a 22 French three-way Foley catheter with a 30 cc balloon was inserted into the bladder.  The balloon was inflated with 30 cc of sterile water.  After multiple manual irrigations ensuring light pink return of the irrigant, the procedure was terminated.  The catheter was attached to a drainage bag and continuous bladder irrigation was started with normal saline.  The patient was positioned supine.  At the end of the procedure, all counts were correct.  Patient tolerated the procedure well and was taken to the recovery room satisfactory condition.  Plan: Continuous bladder irrigation overnight  with gentle Foley traction.  Plan to discharge home tomorrow with Foley catheter in place and void trial in the office in 3 days.  Craig R. Kyros Salzwedel MD [facility]  Pager: [phone]

## 2023-02-03 NOTE — Discharge Instructions (Addendum)
Activity:  You are encouraged to ambulate frequently (about every hour during waking hours) to help prevent blood clots from forming in your legs or lungs.    Diet: You should advance your diet as instructed by your physician.  It will be normal to have some bloating, nausea, and abdominal discomfort intermittently.  Prescriptions:  You will be provided a prescription for pain medication to take as needed.  If your pain is not severe enough to require the prescription pain medication, you may take extra strength Tylenol instead which will have less side effects.  You should also take a prescribed stool softener to avoid straining with bowel movements as the prescription pain medication may constipate you.  What to call us about: You should call the office [phone]) if you develop fever > 101 or develop persistent vomiting. Activity:  You are encouraged to ambulate frequently (about every hour during waking hours) to help prevent blood clots from forming in your legs or lungs.    You have a foley catheter in place. Return to clinic in 3 days for foley removal.  Do not start Xarelto until after being seen in office on Monday.

## 2023-02-03 NOTE — Transfer of Care (Signed)
Immediate Anesthesia Transfer of Care Note  Patient: Craig [name]  Procedure(s) Performed: TRANSURETHRAL RESECTION OF THE PROSTATE (TURP) LEFT URETEROSCOPY/LEFT RETROGRADE PYELOGRAM/ Stone removal (Left)  Patient Location: PACU  Anesthesia Type:General  Level of Consciousness: awake, alert , oriented, and patient cooperative  Airway & Oxygen Therapy: Patient Spontanous Breathing  Post-op Assessment: Report given to RN and Post -op Vital signs reviewed and stable  Post vital signs: Reviewed and stable  Last Vitals:  Vitals Value Taken Time  BP 130/86 02/03/23 0915  Temp    Pulse 78 02/03/23 0915  Resp 12 02/03/23 0915  SpO2 98 % 02/03/23 0915  Vitals shown include unfiled device data.  Last Pain:  Vitals:   02/03/23 0556  TempSrc: Oral  PainSc: 3       Patients Stated Pain Goal: 5 (02/03/23 0556)  Complications: No notable events documented.

## 2023-02-03 NOTE — Anesthesia Postprocedure Evaluation (Signed)
Anesthesia Post Note  Patient: Craig [name]  Procedure(s) Performed: TRANSURETHRAL RESECTION OF THE PROSTATE (TURP) LEFT URETEROSCOPY/LEFT RETROGRADE PYELOGRAM/ Stone removal (Left)     Patient location during evaluation: PACU Anesthesia Type: General Level of consciousness: awake and alert, patient cooperative and oriented Pain management: pain level controlled Vital Signs Assessment: post-procedure vital signs reviewed and stable Respiratory status: spontaneous breathing, nonlabored ventilation and respiratory function stable Cardiovascular status: blood pressure returned to baseline and stable Postop Assessment: no apparent nausea or vomiting and able to ambulate Anesthetic complications: no   No notable events documented.  Last Vitals:  Vitals:   02/03/23 1000 02/03/23 1011  BP: 128/77 (!) 146/87  Pulse: 76 73  Resp: (!) 9 10  Temp: 36.4 C 36.5 C  SpO2: 100% 94%    Last Pain:  Vitals:   02/03/23 1011  TempSrc:   PainSc: 0-No pain                 Francies Inch,E. Danita Proud

## 2023-02-04 DIAGNOSIS — G4733 Obstructive sleep apnea (adult) (pediatric): Secondary | ICD-10-CM | POA: Diagnosis not present

## 2023-02-04 DIAGNOSIS — N210 Calculus in bladder: Secondary | ICD-10-CM | POA: Diagnosis not present

## 2023-02-04 DIAGNOSIS — C61 Malignant neoplasm of prostate: Secondary | ICD-10-CM | POA: Diagnosis not present

## 2023-02-04 DIAGNOSIS — N401 Enlarged prostate with lower urinary tract symptoms: Secondary | ICD-10-CM | POA: Diagnosis not present

## 2023-02-04 DIAGNOSIS — Z86718 Personal history of other venous thrombosis and embolism: Secondary | ICD-10-CM | POA: Diagnosis not present

## 2023-02-04 DIAGNOSIS — N138 Other obstructive and reflux uropathy: Secondary | ICD-10-CM | POA: Diagnosis not present

## 2023-02-04 LAB — CBC
HCT: 44.5 % (ref 39.0–52.0)
Hemoglobin: 14.8 g/dL (ref 13.0–17.0)
MCH: 31.9 pg (ref 26.0–34.0)
MCHC: 33.3 g/dL (ref 30.0–36.0)
MCV: 95.9 fL (ref 80.0–100.0)
Platelets: 198 10*3/uL (ref 150–400)
RBC: 4.64 MIL/uL (ref 4.22–5.81)
RDW: 12.5 % (ref 11.5–15.5)
WBC: 12.8 10*3/uL — ABNORMAL HIGH (ref 4.0–10.5)
nRBC: 0 % (ref 0.0–0.2)

## 2023-02-04 LAB — BASIC METABOLIC PANEL
Anion gap: 7 (ref 5–15)
BUN: 13 mg/dL (ref 8–23)
CO2: 22 mmol/L (ref 22–32)
Calcium: 8.6 mg/dL — ABNORMAL LOW (ref 8.9–10.3)
Chloride: 107 mmol/L (ref 98–111)
Creatinine, Ser: 0.89 mg/dL (ref 0.61–1.24)
GFR, Estimated: >60 mL/min (ref >60)
Glucose, Bld: 106 mg/dL — ABNORMAL HIGH (ref 70–99)
Potassium: 3.9 mmol/L (ref 3.5–5.1)
Sodium: 136 mmol/L (ref 135–145)

## 2023-02-04 MED ORDER — TRAMADOL HCL 50 MG PO TABS: 50.0000 mg | ORAL_TABLET | Freq: Four times a day (QID) | ORAL | 0 refills | Status: DC | PRN

## 2023-02-04 NOTE — Progress Notes (Signed)
Dr. Ronne Binning on Phone is on his way to [facility] hospital to do a case and then going to [facility] to do a case so it will be several hours before he can get here or as long as pt is stable he will put in D/C orders.   Dr. Cardell Peach called on his cell phone didn't answer and mail box was full, he was paged via his pager and called right back and updated that Dr. Ronne Binning is going to [facility] and then [facility] to do a case it will be several hours before he gets.  Pt is stable urine is light pink in color no problems is it OK if pt goes home without being seen by a doctor, dr. Cardell Peach said that was fine.

## 2023-02-04 NOTE — Progress Notes (Signed)
RN called physician on call Dr. Thea Silversmith to come see patient prior to discharged. Dr. Thea Silversmith stated that he was unaware that there was a patient that needed to be discharged from [facility] and that it would be several hours before he would be able to see the patient due to having cases at [facility] that he was enroute to and also having to go to [facility] after. Patient stated desire to go home. Dr. Cardell Peach paged and returned phone call and informed. Stated that patient could be sent home without being seen by physician as long as patient was appropriate and vitals are stable. Dr. Thea Silversmith to put in discharge instructions once he gets to [facility]. Juanda Crumble, [facility] Clinical Supervisor aware.

## 2023-02-07 ENCOUNTER — Encounter (HOSPITAL_BASED_OUTPATIENT_CLINIC_OR_DEPARTMENT_OTHER): Payer: Self-pay | Admitting: Urology

## 2023-02-07 LAB — SURGICAL PATHOLOGY

## 2023-02-09 DIAGNOSIS — R3912 Poor urinary stream: Secondary | ICD-10-CM | POA: Diagnosis not present

## 2023-02-17 DIAGNOSIS — M47816 Spondylosis without myelopathy or radiculopathy, lumbar region: Secondary | ICD-10-CM | POA: Diagnosis not present

## 2023-03-13 DIAGNOSIS — Z6829 Body mass index (BMI) 29.0-29.9, adult: Secondary | ICD-10-CM | POA: Diagnosis not present

## 2023-03-13 DIAGNOSIS — M542 Cervicalgia: Secondary | ICD-10-CM | POA: Diagnosis not present

## 2023-03-13 DIAGNOSIS — G8929 Other chronic pain: Secondary | ICD-10-CM | POA: Diagnosis not present

## 2023-03-13 DIAGNOSIS — Z79899 Other long term (current) drug therapy: Secondary | ICD-10-CM | POA: Diagnosis not present

## 2023-03-13 DIAGNOSIS — M545 Low back pain, unspecified: Secondary | ICD-10-CM | POA: Diagnosis not present

## 2023-03-15 DIAGNOSIS — N411 Chronic prostatitis: Secondary | ICD-10-CM | POA: Diagnosis not present

## 2023-03-15 DIAGNOSIS — C61 Malignant neoplasm of prostate: Secondary | ICD-10-CM | POA: Diagnosis not present

## 2023-03-15 DIAGNOSIS — R3912 Poor urinary stream: Secondary | ICD-10-CM | POA: Diagnosis not present

## 2023-03-15 DIAGNOSIS — N401 Enlarged prostate with lower urinary tract symptoms: Secondary | ICD-10-CM | POA: Diagnosis not present

## 2023-03-16 DIAGNOSIS — Z79899 Other long term (current) drug therapy: Secondary | ICD-10-CM | POA: Diagnosis not present

## 2023-06-06 DIAGNOSIS — M542 Cervicalgia: Secondary | ICD-10-CM | POA: Diagnosis not present

## 2023-06-06 DIAGNOSIS — M545 Low back pain, unspecified: Secondary | ICD-10-CM | POA: Diagnosis not present

## 2023-06-06 DIAGNOSIS — Z79899 Other long term (current) drug therapy: Secondary | ICD-10-CM | POA: Diagnosis not present

## 2023-06-06 DIAGNOSIS — M79604 Pain in right leg: Secondary | ICD-10-CM | POA: Diagnosis not present

## 2023-06-06 DIAGNOSIS — G894 Chronic pain syndrome: Secondary | ICD-10-CM | POA: Diagnosis not present

## 2023-06-12 DIAGNOSIS — Z79899 Other long term (current) drug therapy: Secondary | ICD-10-CM | POA: Diagnosis not present

## 2023-07-25 DIAGNOSIS — E785 Hyperlipidemia, unspecified: Secondary | ICD-10-CM | POA: Diagnosis not present

## 2023-07-25 DIAGNOSIS — N400 Enlarged prostate without lower urinary tract symptoms: Secondary | ICD-10-CM | POA: Diagnosis not present

## 2023-07-25 DIAGNOSIS — Z0000 Encounter for general adult medical examination without abnormal findings: Secondary | ICD-10-CM | POA: Diagnosis not present

## 2023-07-25 DIAGNOSIS — C61 Malignant neoplasm of prostate: Secondary | ICD-10-CM | POA: Diagnosis not present

## 2023-07-25 DIAGNOSIS — Z86718 Personal history of other venous thrombosis and embolism: Secondary | ICD-10-CM | POA: Diagnosis not present

## 2023-07-25 DIAGNOSIS — R7309 Other abnormal glucose: Secondary | ICD-10-CM | POA: Diagnosis not present

## 2023-07-26 DIAGNOSIS — N400 Enlarged prostate without lower urinary tract symptoms: Secondary | ICD-10-CM | POA: Diagnosis not present

## 2023-07-26 DIAGNOSIS — E785 Hyperlipidemia, unspecified: Secondary | ICD-10-CM | POA: Diagnosis not present

## 2023-07-27 DIAGNOSIS — E785 Hyperlipidemia, unspecified: Secondary | ICD-10-CM | POA: Diagnosis not present

## 2023-07-27 DIAGNOSIS — Z86718 Personal history of other venous thrombosis and embolism: Secondary | ICD-10-CM | POA: Diagnosis not present

## 2023-07-27 DIAGNOSIS — R7309 Other abnormal glucose: Secondary | ICD-10-CM | POA: Diagnosis not present

## 2023-07-27 DIAGNOSIS — Z0000 Encounter for general adult medical examination without abnormal findings: Secondary | ICD-10-CM | POA: Diagnosis not present

## 2023-07-28 DIAGNOSIS — Z23 Encounter for immunization: Secondary | ICD-10-CM | POA: Diagnosis not present

## 2023-08-01 DIAGNOSIS — Z79899 Other long term (current) drug therapy: Secondary | ICD-10-CM | POA: Diagnosis not present

## 2023-08-01 DIAGNOSIS — M79604 Pain in right leg: Secondary | ICD-10-CM | POA: Diagnosis not present

## 2023-08-01 DIAGNOSIS — M129 Arthropathy, unspecified: Secondary | ICD-10-CM | POA: Diagnosis not present

## 2023-08-01 DIAGNOSIS — Z6829 Body mass index (BMI) 29.0-29.9, adult: Secondary | ICD-10-CM | POA: Diagnosis not present

## 2023-08-01 DIAGNOSIS — M542 Cervicalgia: Secondary | ICD-10-CM | POA: Diagnosis not present

## 2023-08-01 DIAGNOSIS — M545 Low back pain, unspecified: Secondary | ICD-10-CM | POA: Diagnosis not present

## 2023-08-01 DIAGNOSIS — G894 Chronic pain syndrome: Secondary | ICD-10-CM | POA: Diagnosis not present

## 2023-08-01 DIAGNOSIS — Z1159 Encounter for screening for other viral diseases: Secondary | ICD-10-CM | POA: Diagnosis not present

## 2023-08-03 DIAGNOSIS — D1801 Hemangioma of skin and subcutaneous tissue: Secondary | ICD-10-CM | POA: Diagnosis not present

## 2023-08-03 DIAGNOSIS — Z79899 Other long term (current) drug therapy: Secondary | ICD-10-CM | POA: Diagnosis not present

## 2023-08-03 DIAGNOSIS — D2371 Other benign neoplasm of skin of right lower limb, including hip: Secondary | ICD-10-CM | POA: Diagnosis not present

## 2023-08-03 DIAGNOSIS — L814 Other melanin hyperpigmentation: Secondary | ICD-10-CM | POA: Diagnosis not present

## 2023-08-03 DIAGNOSIS — L821 Other seborrheic keratosis: Secondary | ICD-10-CM | POA: Diagnosis not present

## 2023-08-03 DIAGNOSIS — D2261 Melanocytic nevi of right upper limb, including shoulder: Secondary | ICD-10-CM | POA: Diagnosis not present

## 2023-08-03 DIAGNOSIS — D225 Melanocytic nevi of trunk: Secondary | ICD-10-CM | POA: Diagnosis not present

## 2023-08-03 DIAGNOSIS — L720 Epidermal cyst: Secondary | ICD-10-CM | POA: Diagnosis not present

## 2023-08-03 DIAGNOSIS — L905 Scar conditions and fibrosis of skin: Secondary | ICD-10-CM | POA: Diagnosis not present

## 2023-08-08 DIAGNOSIS — N411 Chronic prostatitis: Secondary | ICD-10-CM | POA: Diagnosis not present

## 2023-08-26 DIAGNOSIS — N400 Enlarged prostate without lower urinary tract symptoms: Secondary | ICD-10-CM | POA: Diagnosis not present

## 2023-08-26 DIAGNOSIS — E785 Hyperlipidemia, unspecified: Secondary | ICD-10-CM | POA: Diagnosis not present

## 2023-08-29 DIAGNOSIS — N401 Enlarged prostate with lower urinary tract symptoms: Secondary | ICD-10-CM | POA: Diagnosis not present

## 2023-08-29 DIAGNOSIS — C61 Malignant neoplasm of prostate: Secondary | ICD-10-CM | POA: Diagnosis not present

## 2023-08-29 DIAGNOSIS — R351 Nocturia: Secondary | ICD-10-CM | POA: Diagnosis not present

## 2023-09-25 DIAGNOSIS — E785 Hyperlipidemia, unspecified: Secondary | ICD-10-CM | POA: Diagnosis not present

## 2023-09-25 DIAGNOSIS — N400 Enlarged prostate without lower urinary tract symptoms: Secondary | ICD-10-CM | POA: Diagnosis not present

## 2023-10-03 DIAGNOSIS — M79604 Pain in right leg: Secondary | ICD-10-CM | POA: Diagnosis not present

## 2023-10-03 DIAGNOSIS — G894 Chronic pain syndrome: Secondary | ICD-10-CM | POA: Diagnosis not present

## 2023-10-03 DIAGNOSIS — Z79899 Other long term (current) drug therapy: Secondary | ICD-10-CM | POA: Diagnosis not present

## 2023-10-03 DIAGNOSIS — M542 Cervicalgia: Secondary | ICD-10-CM | POA: Diagnosis not present

## 2023-10-03 DIAGNOSIS — M545 Low back pain, unspecified: Secondary | ICD-10-CM | POA: Diagnosis not present

## 2023-10-03 DIAGNOSIS — Z6829 Body mass index (BMI) 29.0-29.9, adult: Secondary | ICD-10-CM | POA: Diagnosis not present

## 2023-10-05 DIAGNOSIS — Z79899 Other long term (current) drug therapy: Secondary | ICD-10-CM | POA: Diagnosis not present

## 2023-10-23 DIAGNOSIS — M47816 Spondylosis without myelopathy or radiculopathy, lumbar region: Secondary | ICD-10-CM | POA: Diagnosis not present

## 2023-10-26 DIAGNOSIS — E785 Hyperlipidemia, unspecified: Secondary | ICD-10-CM | POA: Diagnosis not present

## 2023-10-26 DIAGNOSIS — N400 Enlarged prostate without lower urinary tract symptoms: Secondary | ICD-10-CM | POA: Diagnosis not present

## 2023-11-02 DIAGNOSIS — M47816 Spondylosis without myelopathy or radiculopathy, lumbar region: Secondary | ICD-10-CM | POA: Diagnosis not present

## 2023-11-16 ENCOUNTER — Ambulatory Visit
Admission: RE | Admit: 2023-11-16 | Discharge: 2023-11-16 | Disposition: A | Discharge status: Home / Self Care | Source: Ambulatory Visit | Attending: Pulmonary Disease | Admitting: Pulmonary Disease

## 2023-11-16 DIAGNOSIS — R3912 Poor urinary stream: Secondary | ICD-10-CM | POA: Diagnosis not present

## 2023-11-16 DIAGNOSIS — R9389 Abnormal findings on diagnostic imaging of other specified body structures: Secondary | ICD-10-CM

## 2023-11-16 DIAGNOSIS — C61 Malignant neoplasm of prostate: Secondary | ICD-10-CM | POA: Diagnosis not present

## 2023-11-16 DIAGNOSIS — R310 Gross hematuria: Secondary | ICD-10-CM | POA: Diagnosis not present

## 2023-11-16 DIAGNOSIS — N401 Enlarged prostate with lower urinary tract symptoms: Secondary | ICD-10-CM | POA: Diagnosis not present

## 2023-11-17 ENCOUNTER — Other Ambulatory Visit: Payer: Self-pay | Admitting: Urology

## 2023-11-17 DIAGNOSIS — K5730 Diverticulosis of large intestine without perforation or abscess without bleeding: Secondary | ICD-10-CM | POA: Diagnosis not present

## 2023-11-17 DIAGNOSIS — R918 Other nonspecific abnormal finding of lung field: Secondary | ICD-10-CM | POA: Diagnosis not present

## 2023-11-17 DIAGNOSIS — N132 Hydronephrosis with renal and ureteral calculous obstruction: Secondary | ICD-10-CM | POA: Diagnosis not present

## 2023-11-17 DIAGNOSIS — N281 Cyst of kidney, acquired: Secondary | ICD-10-CM | POA: Diagnosis not present

## 2023-11-17 DIAGNOSIS — R310 Gross hematuria: Secondary | ICD-10-CM | POA: Diagnosis not present

## 2023-11-17 DIAGNOSIS — N400 Enlarged prostate without lower urinary tract symptoms: Secondary | ICD-10-CM | POA: Diagnosis not present

## 2023-11-20 NOTE — Patient Instructions (Addendum)
 SURGICAL WAITING ROOM VISITATION Patients having surgery or a procedure may have no more than 2 support people in the waiting area - these visitors may rotate in the visitor waiting room.   Due to an increase in RSV and influenza rates and associated hospitalizations, children ages 96 and under may not visit patients in [facility] hospitals. If the patient needs to stay at the hospital during part of their recovery, the visitor guidelines for inpatient rooms apply.  PRE-OP VISITATION  Pre-op nurse will coordinate an appropriate time for 1 support person to accompany the patient in pre-op.  This support person may not rotate.  This visitor will be contacted when the time is appropriate for the visitor to come back in the pre-op area.  Please refer to the [facility] website for the visitor guidelines for Inpatients (after your surgery is over and you are in a regular room).  You are not required to quarantine at this time prior to your surgery. However, you must do this: Hand Hygiene often Do NOT share personal items Notify your provider if you are in close contact with someone who has COVID or you develop fever 100.4 or greater, new onset of sneezing, cough, sore throat, shortness of breath or body aches.  If you test positive for Covid or have been in contact with anyone that has tested positive in the last 10 days please notify you surgeon.    Your procedure is scheduled on:  11/23/23  Report to [facility] Main Entrance: [place] entrance where the [employer] is available.   Report to admitting at: 1:30 PM  Call this number if you have any questions or problems the morning of surgery [phone]  FOLLOW ANY ADDITIONAL PRE OP INSTRUCTIONS YOU RECEIVED FROM YOUR SURGEON'S OFFICE!!!  Do not eat food after Midnight the night prior to your surgery/procedure.  After Midnight you may have the following liquids until: 12:30 PM DAY OF SURGERY  Clear Liquid Diet Water  Black  Coffee (sugar ok, NO MILK/CREAM OR CREAMERS)  Tea (sugar ok, NO MILK/CREAM OR CREAMERS) regular and decaf                             Plain Jell-O  with no fruit (NO RED)                                           Fruit ices (not with fruit pulp, NO RED)                                     Popsicles (NO RED)                                                                  Juice: NO CITRUS JUICES: only apple, WHITE grape, WHITE cranberry Sports drinks like Gatorade or Powerade (NO RED)   Oral Hygiene is also important to reduce your risk of infection.        Remember - BRUSH YOUR TEETH THE MORNING OF SURGERY WITH YOUR REGULAR TOOTHPASTE  Do NOT smoke after Midnight the night before surgery.  STOP TAKING all Vitamins, Herbs and supplements 1 week before your surgery.   Take ONLY these medicines the morning of surgery with A SIP OF WATER : NONE These are anesthesia recommendations for holding your anticoagulants.  Please contact your prescribing physician to confirm IF it is safe to hold your anticoagulants for this length of time.  Eliquis Apixaban   72 hours   Xarelto  Rivaroxaban    72 hours  Plavix Clopidogrel   120 hours  Pletal Cilostazol   120 hours  HOLD Xarelto  after: 11/19/23  If You have been diagnosed with Sleep Apnea - Bring CPAP mask and tubing day of surgery. We will provide you with a CPAP machine on the day of your surgery.                   You may not have any metal on your body including hair pins, jewelry, and body piercing  Do not wear lotions, powders, perfumes / cologne, or deodorant  Men may shave face and neck.  Contacts, Hearing Aids, dentures or bridgework may not be worn into surgery. DENTURES WILL BE REMOVED PRIOR TO SURGERY PLEASE DO NOT APPLY Poly grip OR ADHESIVES!!!  You may bring a small overnight bag with you on the day of surgery, only pack items that are not valuable. [facility] IS NOT RESPONSIBLE   FOR VALUABLES THAT ARE LOST OR STOLEN.   Patients  discharged on the day of surgery will not be allowed to drive home.  Someone NEEDS to stay with you for the first 24 hours after anesthesia.  Do not bring your home medications to the hospital. The Pharmacy will dispense medications listed on your medication list to you during your admission in the Hospital.  Special Instructions: Bring a copy of your healthcare power of attorney and living will documents the day of surgery, if you wish to have them scanned into your [facility]- EPIC  Please read over the following fact sheets you were given: IF YOU HAVE QUESTIONS ABOUT YOUR PRE-OP INSTRUCTIONS, PLEASE CALL [phone]   [facility] - Preparing for Surgery Before surgery, you can play an important role.  Because skin is not sterile, your skin needs to be as free of germs as possible.  You can reduce the number of germs on your skin by washing with CHG (chlorahexidine gluconate) soap before surgery.  CHG is an antiseptic cleaner which kills germs and bonds with the skin to continue killing germs even after washing. Please DO NOT use if you have an allergy to CHG or antibacterial soaps.  If your skin becomes reddened/irritated stop using the CHG and inform your nurse when you arrive at Short Stay. Do not shave (including legs and underarms) for at least 48 hours prior to the first CHG shower.  You may shave your face/neck.  Please follow these instructions carefully:  1.  Shower with CHG Soap the night before surgery and the  morning of surgery.  2.  If you choose to wash your hair, wash your hair first as usual with your normal  shampoo.  3.  After you shampoo, rinse your hair and body thoroughly to remove the shampoo.                             4.  Use CHG as you would any other liquid soap.  You can apply chg directly  to the skin and wash.  Gently with a scrungie or clean washcloth.  5.  Apply the CHG Soap to your body ONLY FROM THE NECK DOWN.   Do not use on face/ open                            Wound or open sores. Avoid contact with eyes, ears mouth and genitals (private parts).                       Wash face,  Genitals (private parts) with your normal soap.             6.  Wash thoroughly, paying special attention to the area where your  surgery  will be performed.  7.  Thoroughly rinse your body with warm water  from the neck down.  8.  DO NOT shower/wash with your normal soap after using and rinsing off the CHG Soap.            9.  Pat yourself dry with a clean towel.            10.  Wear clean pajamas.            11.  Place clean sheets on your bed the night of your first shower and do not  sleep with pets.  ON THE DAY OF SURGERY : Do not apply any lotions/deodorants the morning of surgery.  Please wear clean clothes to the hospital/surgery center.     FAILURE TO FOLLOW THESE INSTRUCTIONS MAY RESULT IN THE CANCELLATION OF YOUR SURGERY  PATIENT SIGNATURE_________________________________  NURSE SIGNATURE__________________________________  ________________________________________________________________________

## 2023-11-21 ENCOUNTER — Encounter (HOSPITAL_COMMUNITY)
Admission: RE | Admit: 2023-11-21 | Discharge: 2023-11-21 | Disposition: A | Discharge status: Home / Self Care | Source: Ambulatory Visit | Attending: Urology

## 2023-11-21 ENCOUNTER — Other Ambulatory Visit: Payer: Self-pay

## 2023-11-21 ENCOUNTER — Encounter (HOSPITAL_COMMUNITY): Payer: Self-pay

## 2023-11-21 VITALS — BP 129/82 | HR 74 | Temp 98.4°F | Ht 69.0 in | Wt 189.0 lb

## 2023-11-21 DIAGNOSIS — Z7901 Long term (current) use of anticoagulants: Secondary | ICD-10-CM | POA: Insufficient documentation

## 2023-11-21 DIAGNOSIS — B400 Acute pulmonary blastomycosis: Secondary | ICD-10-CM | POA: Diagnosis not present

## 2023-11-21 DIAGNOSIS — Z01812 Encounter for preprocedural laboratory examination: Secondary | ICD-10-CM | POA: Diagnosis not present

## 2023-11-21 DIAGNOSIS — I2510 Atherosclerotic heart disease of native coronary artery without angina pectoris: Secondary | ICD-10-CM | POA: Insufficient documentation

## 2023-11-21 DIAGNOSIS — Z86718 Personal history of other venous thrombosis and embolism: Secondary | ICD-10-CM | POA: Insufficient documentation

## 2023-11-21 DIAGNOSIS — N201 Calculus of ureter: Secondary | ICD-10-CM | POA: Insufficient documentation

## 2023-11-21 DIAGNOSIS — G4733 Obstructive sleep apnea (adult) (pediatric): Secondary | ICD-10-CM | POA: Diagnosis not present

## 2023-11-21 DIAGNOSIS — Z01818 Encounter for other preprocedural examination: Secondary | ICD-10-CM | POA: Diagnosis present

## 2023-11-21 HISTORY — DX: Unspecified osteoarthritis, unspecified site: M19.90

## 2023-11-21 HISTORY — DX: Peripheral vascular disease, unspecified: I73.9

## 2023-11-21 HISTORY — DX: Pneumonia, unspecified organism: J18.9

## 2023-11-21 LAB — BASIC METABOLIC PANEL WITH GFR
Anion gap: 7 (ref 5–15)
BUN: 17 mg/dL (ref 8–23)
CO2: 26 mmol/L (ref 22–32)
Calcium: 9.4 mg/dL (ref 8.9–10.3)
Chloride: 104 mmol/L (ref 98–111)
Creatinine, Ser: 0.99 mg/dL (ref 0.61–1.24)
GFR, Estimated: >60 mL/min (ref >60)
Glucose, Bld: 107 mg/dL — ABNORMAL HIGH (ref 70–99)
Potassium: 4.6 mmol/L (ref 3.5–5.1)
Sodium: 137 mmol/L (ref 135–145)

## 2023-11-21 LAB — CBC
HCT: 47 % (ref 39.0–52.0)
Hemoglobin: 15.3 g/dL (ref 13.0–17.0)
MCH: 30.8 pg (ref 26.0–34.0)
MCHC: 32.6 g/dL (ref 30.0–36.0)
MCV: 94.8 fL (ref 80.0–100.0)
Platelets: 246 K/uL (ref 150–400)
RBC: 4.96 MIL/uL (ref 4.22–5.81)
RDW: 12.4 % (ref 11.5–15.5)
WBC: 6.1 K/uL (ref 4.0–10.5)
nRBC: 0 % (ref 0.0–0.2)

## 2023-11-21 NOTE — Progress Notes (Addendum)
 For Anesthesia: PCP - Kip Righter, MD  Cardiologist - Shlomo Wilbert SAUNDERS, MD   Bowel Prep reminder:  Chest x-ray - CT chest: 11/17/23 EKG - 12/13/22 Stress Test -  ECHO - 01/02/20 Cardiac Cath -  Pacemaker/ICD device last checked: Pacemaker orders received: Device Rep notified:  Spinal Cord Stimulator:N/A  Sleep Study - Yes CPAP - NO  Fasting Blood Sugar - N/A Checks Blood Sugar _____ times a day Date and result of last Hgb A1c-  Last dose of GLP1 agonist- N/A GLP1 instructions:   Last dose of SGLT-2 inhibitors- N/A SGLT-2 instructions:   Blood Thinner Instructions:Xarelto : last dose: 11/20/23 Aspirin Instructions: Last Dose:  Activity level: Can go up a flight of stairs and activities of daily living without stopping and without chest pain and/or shortness of breath   Able to exercise without chest pain and/or shortness of breath  Anesthesia review: Hx: CAD,DVT,OSA  Patient denies shortness of breath, fever, cough and chest pain at PAT appointment   Patient verbalized understanding of instructions that were reviewed over the telephone.

## 2023-11-22 NOTE — Progress Notes (Signed)
 Anesthesia Chart Review   Case: [id number] Date/Time: 11/23/23 1530   Procedures:      CYSTOSCOPY/URETEROSCOPY/HOLMIUM LASER/STENT PLACEMENT (Left)     CYSTOSCOPY, WITH RETROGRADE PYELOGRAM (Left)   Anesthesia type: General   Diagnosis: Calculus of ureter [N20.1]   Pre-op diagnosis: LEFT URETERAL CALCULI   Location: [facility] ROOM 05 / [facility]   Surgeons: Shane Steffan BROCKS, MD       DISCUSSION:72 y.o. never smoker with h/o OSA using oral device, recurrent DVT on Xarelto , mild to moderate non-obstructive CAD on coronary CTA 2021, BPH, left ureteral calculi scheduled for above procedure 11/23/2023 with Dr. Steffan Shane.   S/p TURP 02/03/2023 at [facility] with no anesthesia complications noted.   Pt last seen by cardiology 12/13/2022. Stable at this visit with 1 year follow up recommended.   Pt reports at PAT visit he can climb a flight of stairs without difficulty, denies chest pain or shortness of breath.   Pt reports last dose of Xarelto  11/20/2023.  VS: BP 129/82   Pulse 74   Temp 36.9 C (Oral)   Ht 5' 9 (1.753 m)   Wt 85.7 kg   SpO2 99%   BMI 27.91 kg/m   PROVIDERS: Kip Righter, MD is PCP   Cardiologist - Shlomo Wilbert SAUNDERS, MD    LABS: Labs reviewed: Acceptable for surgery. (all labs ordered are listed, but only abnormal results are displayed)  Labs Reviewed  BASIC METABOLIC PANEL WITH GFR - Abnormal; Notable for the following components:      Result Value   Glucose, Bld 107 (*)    All other components within normal limits  CBC     IMAGES:   EKG:   CV: Echo 01/02/2020 1. Left ventricular ejection fraction, by estimation, is 60 to 65%. The  left ventricle has normal function. The left ventricle has no regional  wall motion abnormalities. There is mild concentric left ventricular  hypertrophy of the basal-septal segment.  Left ventricular diastolic parameters are consistent with Grade I  diastolic dysfunction (impaired relaxation).   2. Right ventricular systolic  function is normal. The right ventricular  size is normal.   3. Left atrial size was mildly dilated.   4. The mitral valve is grossly normal. Trivial mitral valve  regurgitation.   5. The aortic valve is tricuspid. Aortic valve regurgitation is trivial.  No aortic stenosis is present.  Past Medical History:  Diagnosis Date   Anticoagulant long-term use    xarelto -- managed by pcp   Arthritis    BPH associated with nocturia    CAD (coronary artery disease) 2021   cardiologist--- dr t. turner;   CCTA 12-30-2019  score 158;  mild and  nonobstructive involving RCA/ LAD/ RI   Cervical radicular pain    DDD (degenerative disc disease), thoracolumbar    Elevated glucose    Family history of adverse reaction to anesthesia    daughter--- ponv   Hematuria    History of DVT of lower extremity    per pt LLE  2007 and recurrent 2016  LLE  (01-31-2023  when asked pt does not remember every being work-up for blood clotting disorders)   History of kidney stones    History of prostatitis    Hyperlipidemia LDL goal <70 12/13/2022   Multiple lung nodules on CT    pulmonolgy--- dr neda;  stated not malig , due to inflammatory process   Orthostatic intolerance    OSA (obstructive sleep apnea)    01-31-2023 per pt  using oral device   Peripheral vascular disease (HCC)    Pneumonia    Vestibular dysfunction of both ears    due to anttiviotic , gentamycin   Wears glasses    Wears hearing aid in both ears     Past Surgical History:  Procedure Laterality Date   ANTERIOR CERVICAL DECOMP/DISCECTOMY FUSION  2010   in [place];  C5--6   CYSTOSCOPY W/ URETERAL STENT PLACEMENT     approx 2005   CYSTOSCOPY/URETEROSCOPY/HOLMIUM LASER/STENT PLACEMENT Left 02/03/2023   Procedure: LEFT URETEROSCOPY/LEFT RETROGRADE PYELOGRAM/ Stone removal;  Surgeon: Selma Donnice SAUNDERS, MD;  Location: [facility];  Service: Urology;  Laterality: Left;   EXTRACORPOREAL SHOCK WAVE LITHOTRIPSY Left 12/31/2018    Procedure: EXTRACORPOREAL SHOCK WAVE LITHOTRIPSY (ESWL);  Surgeon: Nieves Donnice, MD;  [facility];  Service: Urology;  Laterality: Left;   IR THORACENTESIS ASP PLEURAL SPACE W/IMG GUIDE  04/23/2018   LUMBAR DISC SURGERY  2006   L5--S1   LUMBAR FUSION  02/1999   L4--5   SHOULDER ARTHROSCOPY WITH ROTATOR CUFF REPAIR Right 09/14/2021   @[facility]  by dr dozier   TRANSURETHRAL RESECTION OF PROSTATE N/A 02/03/2023   Procedure: TRANSURETHRAL RESECTION OF THE PROSTATE (TURP);  Surgeon: Selma Donnice SAUNDERS, MD;  Location: [facility];  Service: Urology;  Laterality: N/A;    MEDICATIONS:  atorvastatin  (LIPITOR) 10 MG tablet   cyclobenzaprine  (FLEXERIL ) 5 MG tablet   diphenhydrAMINE  (BENADRYL ) 25 MG tablet   Glucosamine HCl (GLUCOSAMINE PO)   HYDROcodone -acetaminophen  (NORCO) 10-325 MG tablet   ondansetron  (ZOFRAN -ODT) 4 MG disintegrating tablet   rivaroxaban  (XARELTO ) 10 MG TABS tablet   tamsulosin  (FLOMAX ) 0.4 MG CAPS capsule   No current facility-administered medications for this encounter.    Harlene Hoots Ward, PA-C [facility] [phone]

## 2023-11-22 NOTE — Anesthesia Preprocedure Evaluation (Signed)
 Anesthesia Evaluation  Patient identified by MRN, date of birth, ID band Patient awake    Reviewed: Allergy & Precautions, NPO status , Patient's Chart, lab work & pertinent test results  Airway Mallampati: I  TM Distance: >3 FB Neck ROM: Full    Dental  (+) Teeth Intact, Dental Advisory Given   Pulmonary sleep apnea    breath sounds clear to auscultation       Cardiovascular + CAD and + Peripheral Vascular Disease   Rhythm:Regular Rate:Normal  Echo:   1. Left ventricular ejection fraction, by estimation, is 60 to 65%. The  left ventricle has normal function. The left ventricle has no regional  wall motion abnormalities. There is mild concentric left ventricular  hypertrophy of the basal-septal segment.  Left ventricular diastolic parameters are consistent with Grade I  diastolic dysfunction (impaired relaxation).   2. Right ventricular systolic function is normal. The right ventricular  size is normal.   3. Left atrial size was mildly dilated.   4. The mitral valve is grossly normal. Trivial mitral valve  regurgitation.   5. The aortic valve is tricuspid. Aortic valve regurgitation is trivial.  No aortic stenosis is present.     Neuro/Psych negative neurological ROS  negative psych ROS   GI/Hepatic negative GI ROS, Neg liver ROS,,,  Endo/Other  negative endocrine ROS    Renal/GU negative Renal ROS     Musculoskeletal  (+) Arthritis ,    Abdominal   Peds  Hematology negative hematology ROS (+)   Anesthesia Other Findings   Reproductive/Obstetrics                              Anesthesia Physical Anesthesia Plan  ASA: 3  Anesthesia Plan: General   Post-op Pain Management: Tylenol  PO (pre-op)* and Toradol IV (intra-op)*   Induction: Intravenous  PONV Risk Score and Plan: 3 and Ondansetron , Dexamethasone  and Midazolam   Airway Management Planned: LMA  Additional Equipment:  None  Intra-op Plan:   Post-operative Plan: Extubation in OR  Informed Consent: I have reviewed the patients History and Physical, chart, labs and discussed the procedure including the risks, benefits and alternatives for the proposed anesthesia with the patient or authorized representative who has indicated his/her understanding and acceptance.     Dental advisory given  Plan Discussed with: CRNA  Anesthesia Plan Comments: (See PAT note 11/21/2023)         Anesthesia Quick Evaluation

## 2023-11-22 NOTE — H&P (Signed)
 H&P   Chief Complaint: L proximal ureteral stones  History of Present Illness: Craig [name] w/ 2 L proximal ureteral stones found on 11/17/23. Here today for L URS w/ LL.   Initially presented with left-sided flank pain and gross hematuria. Pain was intermittent for the last 30 days. He denies fevers and chills. He has a history of kidney stones and states this feels the same.    Past Medical History:  Diagnosis Date   Anticoagulant long-term use    xarelto -- managed by pcp   Arthritis    BPH associated with nocturia    CAD (coronary artery disease) 2021   cardiologist--- dr t. turner;   CCTA 12-30-2019  score 158;  mild and  nonobstructive involving RCA/ LAD/ RI   Cervical radicular pain    DDD (degenerative disc disease), thoracolumbar    Elevated glucose    Family history of adverse reaction to anesthesia    daughter--- ponv   Hematuria    History of DVT of lower extremity    per pt LLE  2007 and recurrent 2016  LLE  (01-31-2023  when asked pt does not remember every being work-up for blood clotting disorders)   History of kidney stones    History of prostatitis    Hyperlipidemia LDL goal <70 12/13/2022   Multiple lung nodules on CT    pulmonolgy--- dr neda;  stated not malig , due to inflammatory process   Orthostatic intolerance    OSA (obstructive sleep apnea)    01-31-2023 per pt using oral device   Peripheral vascular disease (HCC)    Pneumonia    Vestibular dysfunction of both ears    due to anttiviotic , gentamycin   Wears glasses    Wears hearing aid in both ears    Past Surgical History:  Procedure Laterality Date   ANTERIOR CERVICAL DECOMP/DISCECTOMY FUSION  2010   in [place];  C5--6   CYSTOSCOPY W/ URETERAL STENT PLACEMENT     approx 2005   CYSTOSCOPY/URETEROSCOPY/HOLMIUM LASER/STENT PLACEMENT Left 02/03/2023   Procedure: LEFT URETEROSCOPY/LEFT RETROGRADE PYELOGRAM/ Stone removal;  Surgeon: Selma Donnice SAUNDERS, MD;  Location[facility];  Service:  Urology;  Laterality: Left;   EXTRACORPOREAL SHOCK WAVE LITHOTRIPSY Left 12/31/2018   Procedure: EXTRACORPOREAL SHOCK WAVE LITHOTRIPSY (ESWL);  Surgeon: Nieves Donnice, MD;  [facility];  Service: Urology;  Laterality: Left;   IR THORACENTESIS ASP PLEURAL SPACE W/IMG GUIDE  04/23/2018   LUMBAR DISC SURGERY  2006   L5--S1   LUMBAR FUSION  02/1999   L4--5   SHOULDER ARTHROSCOPY WITH ROTATOR CUFF REPAIR Right 09/14/2021   @[facility]  by dr dozier   TRANSURETHRAL RESECTION OF PROSTATE N/A 02/03/2023   Procedure: TRANSURETHRAL RESECTION OF THE PROSTATE (TURP);  Surgeon: Selma Donnice SAUNDERS, MD;  Location: [facility];  Service: Urology;  Laterality: N/A;    Home Medications:  No medications prior to admission.   Allergies:  Allergies  Allergen Reactions   Gentamycin [Gentamicin] Other (See Comments)    Damaged L inner ear   Nsaids Other (See Comments)    Pt avoids , is on Xarelto    Vancomycin Rash    Red man syndrome    Family History  Problem Relation Age of Onset   Colon cancer Mother    Social History:  reports that he has never smoked. He has never used smokeless tobacco. He reports current alcohol use of about 3.0 standard drinks of alcohol per week. He reports that he  does not use drugs.  ROS: A complete review of systems was performed.  All systems are negative except for pertinent findings as noted. ROS   Physical Exam:  Vital signs in last 24 hours:   General:  Alert and oriented, No acute distress HEENT: Normocephalic, atraumatic Neck: No JVD or lymphadenopathy Cardiovascular: Regular rate and rhythm Lungs: Regular rate and effort Abdomen: Soft, nontender, nondistended, no abdominal masses Back: No CVA tenderness Extremities: No edema Neurologic: Grossly intact  Laboratory Data:  No results found for this or any previous visit (from the past 24 hours). No results found for this or any previous visit (from the past 240 hours). Creatinine: Recent  Labs    11/21/23 0821  CREATININE 0.99    Impression/Assessment:  Craig [name] w/ 2 L proximal ureteral stones found on 11/17/23. Here today for L URS w/ LL.   We discussed risk benefits alternatives to ureteroscopy.  This included bleeding infection and damage to surrounding structures surrounding structures including ureter as well as urethra.  We discussed the need for stent postoperatively as well as the potential symptoms of stent placement.  We discussed possible inability to complete procedure due to caliber of your ureter or inability to pass stone possibly requiring long-term stent versus nephrostomy tube.  We discussed need for possible second surgery.  Patient voiced their understanding and consent was obtained.  Pt has been on abx prior to the procedure    Plan:  Plan for L URS w/ LL   Craig [name] 11/22/2023, 6:39 PM

## 2023-11-23 ENCOUNTER — Encounter (HOSPITAL_COMMUNITY): Payer: Self-pay | Admitting: Medical

## 2023-11-23 ENCOUNTER — Ambulatory Visit (HOSPITAL_COMMUNITY)
Admission: RE | Admit: 2023-11-23 | Discharge: 2023-11-23 | Disposition: A | Discharge status: Home / Self Care | Attending: Urology | Admitting: Urology

## 2023-11-23 ENCOUNTER — Other Ambulatory Visit: Payer: Self-pay

## 2023-11-23 ENCOUNTER — Encounter (HOSPITAL_COMMUNITY)
Admission: RE | Disposition: A | Discharge status: Home / Self Care | Payer: Self-pay | Source: Home / Self Care | Attending: Urology

## 2023-11-23 ENCOUNTER — Encounter (HOSPITAL_COMMUNITY): Payer: Self-pay | Admitting: Urology

## 2023-11-23 ENCOUNTER — Ambulatory Visit (HOSPITAL_COMMUNITY)

## 2023-11-23 ENCOUNTER — Ambulatory Visit (HOSPITAL_COMMUNITY): Admitting: Physician Assistant

## 2023-11-23 DIAGNOSIS — Z7901 Long term (current) use of anticoagulants: Secondary | ICD-10-CM | POA: Insufficient documentation

## 2023-11-23 DIAGNOSIS — Z87442 Personal history of urinary calculi: Secondary | ICD-10-CM | POA: Insufficient documentation

## 2023-11-23 DIAGNOSIS — G4730 Sleep apnea, unspecified: Secondary | ICD-10-CM | POA: Diagnosis not present

## 2023-11-23 DIAGNOSIS — E785 Hyperlipidemia, unspecified: Secondary | ICD-10-CM

## 2023-11-23 DIAGNOSIS — I2510 Atherosclerotic heart disease of native coronary artery without angina pectoris: Secondary | ICD-10-CM

## 2023-11-23 DIAGNOSIS — R310 Gross hematuria: Secondary | ICD-10-CM | POA: Diagnosis not present

## 2023-11-23 DIAGNOSIS — G4733 Obstructive sleep apnea (adult) (pediatric): Secondary | ICD-10-CM | POA: Diagnosis not present

## 2023-11-23 DIAGNOSIS — N201 Calculus of ureter: Secondary | ICD-10-CM | POA: Diagnosis not present

## 2023-11-23 DIAGNOSIS — Z86718 Personal history of other venous thrombosis and embolism: Secondary | ICD-10-CM | POA: Diagnosis not present

## 2023-11-23 DIAGNOSIS — N132 Hydronephrosis with renal and ureteral calculous obstruction: Secondary | ICD-10-CM | POA: Diagnosis not present

## 2023-11-23 SURGERY — CYSTOSCOPY/URETEROSCOPY/HOLMIUM LASER/STENT PLACEMENT
Anesthesia: General | Laterality: Left

## 2023-11-23 MED ORDER — PHENAZOPYRIDINE HCL 200 MG PO TABS
200.0000 mg | ORAL_TABLET | Freq: Three times a day (TID) | ORAL | 0 refills | Status: DC | PRN
Start: 2023-11-23 — End: 2024-01-09

## 2023-11-23 MED ORDER — PHENYLEPHRINE 80 MCG/ML (10ML) SYRINGE FOR IV PUSH (FOR BLOOD PRESSURE SUPPORT)
PREFILLED_SYRINGE | INTRAVENOUS | Status: DC | PRN
  Administered 2023-11-23: 80 ug via INTRAVENOUS

## 2023-11-23 MED ORDER — TAMSULOSIN HCL 0.4 MG PO CAPS: 0.4000 mg | ORAL_CAPSULE | Freq: Every day | ORAL | 0 refills | Status: DC

## 2023-11-23 MED ORDER — IOHEXOL 300 MG/ML  SOLN
INTRAMUSCULAR | Status: DC | PRN
  Administered 2023-11-23: 10 mL

## 2023-11-23 MED ORDER — CHLORHEXIDINE GLUCONATE 0.12 % MT SOLN
15.0000 mL | Freq: Once | OROMUCOSAL | Status: AC
  Administered 2023-11-23: 15 mL via OROMUCOSAL

## 2023-11-23 MED ORDER — ORAL CARE MOUTH RINSE: 15.0000 mL | Freq: Once | OROMUCOSAL | Status: AC

## 2023-11-23 MED ORDER — ACETAMINOPHEN 10 MG/ML IV SOLN
1000.0000 mg | Freq: Once | INTRAVENOUS | Status: DC | PRN
Start: 2023-11-23 — End: 2023-11-23

## 2023-11-23 MED ORDER — FENTANYL CITRATE (PF) 100 MCG/2ML IJ SOLN
INTRAMUSCULAR | Status: DC | PRN
  Administered 2023-11-23 (×2): 50 ug via INTRAVENOUS

## 2023-11-23 MED ORDER — LIDOCAINE HCL (PF) 2 % IJ SOLN
INTRAMUSCULAR | Status: AC
  Filled 2023-11-23: qty 5

## 2023-11-23 MED ORDER — FENTANYL CITRATE PF 50 MCG/ML IJ SOSY: 25.0000 ug | PREFILLED_SYRINGE | INTRAMUSCULAR | Status: DC | PRN

## 2023-11-23 MED ORDER — OXYCODONE HCL 5 MG/5ML PO SOLN: 5.0000 mg | Freq: Once | ORAL | Status: DC | PRN

## 2023-11-23 MED ORDER — LACTATED RINGERS IV SOLN: INTRAVENOUS | Status: DC | PRN

## 2023-11-23 MED ORDER — DROPERIDOL 2.5 MG/ML IJ SOLN: 0.6250 mg | Freq: Once | INTRAMUSCULAR | Status: DC | PRN

## 2023-11-23 MED ORDER — FENTANYL CITRATE (PF) 100 MCG/2ML IJ SOLN
INTRAMUSCULAR | Status: AC
Start: 2023-11-23 — End: 2023-11-23
  Filled 2023-11-23: qty 2

## 2023-11-23 MED ORDER — SODIUM CHLORIDE 0.9 % IR SOLN
Status: DC | PRN
  Administered 2023-11-23: 3000 mL

## 2023-11-23 MED ORDER — PROPOFOL 10 MG/ML IV BOLUS
INTRAVENOUS | Status: AC
  Filled 2023-11-23: qty 20

## 2023-11-23 MED ORDER — PROPOFOL 10 MG/ML IV BOLUS
INTRAVENOUS | Status: DC | PRN
  Administered 2023-11-23: 150 mg via INTRAVENOUS
  Administered 2023-11-23: 50 mg via INTRAVENOUS

## 2023-11-23 MED ORDER — SUGAMMADEX SODIUM 200 MG/2ML IV SOLN
INTRAVENOUS | Status: AC
  Filled 2023-11-23: qty 2

## 2023-11-23 MED ORDER — METHOCARBAMOL 750 MG PO TABS: 750.0000 mg | ORAL_TABLET | Freq: Four times a day (QID) | ORAL | 0 refills | Status: AC

## 2023-11-23 MED ORDER — OXYCODONE HCL 5 MG PO TABS: 5.0000 mg | ORAL_TABLET | Freq: Once | ORAL | Status: DC | PRN

## 2023-11-23 MED ORDER — LIDOCAINE HCL (PF) 2 % IJ SOLN
INTRAMUSCULAR | Status: DC | PRN
  Administered 2023-11-23: 100 mg via INTRADERMAL

## 2023-11-23 MED ORDER — HYOSCYAMINE SULFATE 0.125 MG PO TBDP
0.1250 mg | ORAL_TABLET | Freq: Four times a day (QID) | ORAL | 0 refills | Status: DC | PRN
Start: 2023-11-23 — End: 2023-12-27

## 2023-11-23 MED ORDER — LACTATED RINGERS IV SOLN: INTRAVENOUS | Status: DC

## 2023-11-23 MED ORDER — PHENYLEPHRINE 80 MCG/ML (10ML) SYRINGE FOR IV PUSH (FOR BLOOD PRESSURE SUPPORT)
PREFILLED_SYRINGE | INTRAVENOUS | Status: AC
  Filled 2023-11-23: qty 10

## 2023-11-23 MED ORDER — DEXAMETHASONE SODIUM PHOSPHATE 10 MG/ML IJ SOLN
INTRAMUSCULAR | Status: DC | PRN
  Administered 2023-11-23: 8 mg via INTRAVENOUS

## 2023-11-23 MED ORDER — ONDANSETRON HCL 4 MG/2ML IJ SOLN
INTRAMUSCULAR | Status: AC
  Filled 2023-11-23: qty 2

## 2023-11-23 MED ORDER — CEFAZOLIN SODIUM-DEXTROSE 2-4 GM/100ML-% IV SOLN
2.0000 g | INTRAVENOUS | Status: AC
  Administered 2023-11-23: 2 g via INTRAVENOUS
  Filled 2023-11-23: qty 100

## 2023-11-23 MED ORDER — ONDANSETRON HCL 4 MG/2ML IJ SOLN
INTRAMUSCULAR | Status: DC | PRN
  Administered 2023-11-23: 4 mg via INTRAVENOUS

## 2023-11-23 SURGICAL SUPPLY — 24 items
BAG COUNTER SPONGE SURGICOUNT (BAG) IMPLANT
BAG URO CATCHER STRL LF (MISCELLANEOUS) ×1 IMPLANT
BASKET ZERO TIP NITINOL 2.4FR (BASKET) IMPLANT
CATH URETL OPEN 5X70 (CATHETERS) IMPLANT
CLOTH BEACON ORANGE TIMEOUT ST (SAFETY) ×1 IMPLANT
DRSG TEGADERM 2-3/8X2-3/4 SM (GAUZE/BANDAGES/DRESSINGS) IMPLANT
EXTRACTOR STONE 1.7FRX115CM (UROLOGICAL SUPPLIES) IMPLANT
EXTRACTOR STONE NITINOL NGAGE (UROLOGICAL SUPPLIES) IMPLANT
FIBER LASER MOSES 200 DFL (Laser) IMPLANT
FIBER LASER MOSES 365 DFL (Laser) IMPLANT
GLOVE BIO SURGEON STRL SZ8 (GLOVE) ×1 IMPLANT
GOWN STRL REUS W/ TWL XL LVL3 (GOWN DISPOSABLE) ×1 IMPLANT
GUIDEWIRE ANG ZIPWIRE 038X150 (WIRE) IMPLANT
GUIDEWIRE STR DUAL SENSOR (WIRE) ×1 IMPLANT
KIT TURNOVER KIT A (KITS) ×1 IMPLANT
MANIFOLD NEPTUNE II (INSTRUMENTS) ×1 IMPLANT
NS IRRIG 1000ML POUR BTL (IV SOLUTION) IMPLANT
PACK CYSTO (CUSTOM PROCEDURE TRAY) ×1 IMPLANT
SHEATH NAVIGATOR HD 11/13X28 (SHEATH) IMPLANT
SHEATH NAVIGATOR HD 11/13X36 (SHEATH) ×1 IMPLANT
STENT URET 6FRX26 CONTOUR (STENTS) IMPLANT
TRACTIP FLEXIVA PULS ID 200XHI (Laser) IMPLANT
TUBING CONNECTING 10 (TUBING) ×1 IMPLANT
TUBING UROLOGY SET (TUBING) ×1 IMPLANT

## 2023-11-23 NOTE — Anesthesia Procedure Notes (Signed)
 Procedure Name: LMA Insertion Date/Time: 11/23/2023 4:30 PM  Performed by: Obadiah Reyes BROCKS, CRNAPre-anesthesia Checklist: Patient identified, Emergency Drugs available, Suction available and Patient being monitored Patient Re-evaluated:Patient Re-evaluated prior to induction Oxygen Delivery Method: Circle System Utilized Preoxygenation: Pre-oxygenation with 100% oxygen Induction Type: IV induction Ventilation: Mask ventilation without difficulty LMA: LMA inserted LMA Size: 5.0 Number of attempts: 1 Airway Equipment and Method: Bite block Placement Confirmation: positive ETCO2 Tube secured with: Tape Dental Injury: Teeth and Oropharynx as per pre-operative assessment

## 2023-11-23 NOTE — Discharge Instructions (Signed)
 DISCHARGE INSTRUCTIONS FOR Ureteroscopy and/or Ureteral Stent Placement  MEDICATIONS:  1.  Robaxin  2. Tamsulosin   3. Hyoscyamine   4. Pyridium    ACTIVITY:  1. No strenuous activity x 1week  2. No driving while on narcotic pain medications  3. Drink plenty of water   4. Continue to walk at home - it is normal to see blood in the urine while the stent is in place, so keep active, but don't over do it.  5. May return to work/school tomorrow or when you feel ready  6. You may experience some pain when urinating in the kidney on the side that was operated on while the stent is in place this is normal  WHAT IS NORMAL TO EXPERIENCE: It is normal to feel the urge to urinate while the stent is in place It is normal to have blood in your urine while the stent is in place  It sometimes can be normal to have pain in your kidney when you urinate   BATHING:  1. You can shower and we recommend daily showers  2. You have a string coming from your urethra: The stent string is attached to your ureteral stent. Do not pull on this until instructed.   DIET: You may return to your normal diet immediately. Because of the raw surface of your bladder, alcohol, spicy foods, acid type foods and drinks with caffeine may cause irritation or frequency and should be used in moderation. To keep your urine flowing freely and to avoid constipation, drink plenty of fluids during the day ( 8-10 glasses ). Tip: Avoid cranberry juice because it is very acidic.  SIGNS/SYMPTOMS TO CALL:  Please call us  if you have a fever greater than 101.5, uncontrolled nausea/vomiting, uncontrolled pain, dizziness, unable to urinate, bloody urine with clots greater than the size of a quarter, chest pain, shortness of breath, leg swelling, leg pain, redness around wound, drainage from wound, or any other concerns or questions.   You can reach us  at [phone].   FOLLOW-UP:  1. You may remove your stent in on Tuesday 9/2. To do this go  into the shower, grab hold of the tether coming from your urethra. Pull the tether consistent motion until the stent is removed from your body. The stent will be around 10 inches long with a curl on either end.  2. You you have been set up for f/u in 6-8 weeks

## 2023-11-23 NOTE — Anesthesia Postprocedure Evaluation (Signed)
 Anesthesia Post Note  Patient: Gregg Winchell  Procedure(s) Performed: CYSTOSCOPY/URETEROSCOPY/HOLMIUM LASER/STENT PLACEMENT (Left) CYSTOSCOPY, WITH RETROGRADE PYELOGRAM (Left)     Patient location during evaluation: PACU Anesthesia Type: General Level of consciousness: awake Pain management: pain level controlled Vital Signs Assessment: post-procedure vital signs reviewed and stable Respiratory status: spontaneous breathing, nonlabored ventilation and respiratory function stable Cardiovascular status: blood pressure returned to baseline and stable Postop Assessment: no apparent nausea or vomiting Anesthetic complications: no   No notable events documented.  Last Vitals:  Vitals:   11/23/23 1730 11/23/23 1744  BP: (!) 169/95 (!) 144/93  Pulse: 66 67  Resp: 10 14  Temp:  36.4 C  SpO2: 95% 97%    Last Pain:  Vitals:   11/23/23 1744  TempSrc: Oral  PainSc: 0-No pain                 Delon Aisha Arch

## 2023-11-23 NOTE — Op Note (Signed)
 Preoperative diagnosis: left ureteral calculus  Postoperative diagnosis: left ureteral calculus  Procedure:  Cystoscopy left ureteroscopy and stone removal Ureteroscopic laser lithotripsy left 35F x 26 ureteral stent placement w/ strings  left retrograde pyelography with interpretation  Surgeon: Dr. Steffan Pea  Anesthesia: General  Complications: None  Intraoperative findings:  Mid ureteral stone fragmented and all fragments removed from the ureter. No injury or abnormalities to the ureter. 6 x 26 double-J stent left in the left ureter with strings  Left retrograde pyelogram interpretation: Slight narrowing in the areas of the previous stone no filling defects nondilated Pelvis.  EBL: Minimal  Specimens: None  Disposition of specimens: [facility] for stone analysis  Indication: Craig [name] is a 73 y.o.   patient with a history of 2 left ureteral stones he was able to pass 1 prior to surgery but still is having pain on the left side due to this and significant hydronephrosis decision was made to pursue ureteroscopy.. After reviewing the management options for treatment, the patient elected to proceed with the above surgical procedure(s). We have discussed the potential benefits and risks of the procedure, side effects of the proposed treatment, the likelihood of the patient achieving the goals of the procedure, and any potential problems that might occur during the procedure or recuperation. Informed consent has been obtained.   Description of procedure:  The patient was taken to the operating room and general anesthesia was induced.  The patient was placed in the dorsal lithotomy position, prepped and draped in the usual sterile fashion, and preoperative antibiotics were administered. A preoperative time-out was performed.   Cystourethroscopy was performed.  Had slight stenosis at the meatus this was gently dilated to accommodate the scope.  The prostate  demonstrated previous evidence of TURP.  Pan cystoscopy was performed and the bladder systematically examined in its entirety. There was no evidence for any bladder tumors, stones, or other mucosal pathology.    Attention then turned to the left ureteral orifice.  A 0.38 sensor guidewire was then advanced up the left ureter into the renal pelvis under fluoroscopic guidance. The 4.5 Fr semirigid ureteroscope was then advanced into the ureter next to the guidewire and the calculus was identified.   The stone was then fragmented with the 365 micron holmium laser fiber on a setting of 0.6 and frequency of 6 Hz.   All stones were then removed from the ureter with an N-gage nitinol basket.  Reinspection of the ureter revealed no remaining visible stones or fragments.   A retrograde pyelogram was then performed noting no abnormalities to the ureter slight narrowing no dilation no filling defects.  After this semirigid ureteroscope was removed.  The wire that was still in place was used to place a stent under fluoroscopic guidance.  After the wire was removed proper curl within the kidney as well as the bladder was noted.  The bladder was then emptied and the procedure ended.  The patient appeared to tolerate the procedure well and without complications.  The patient was able to be awakened and transferred to the recovery unit in satisfactory condition.   Disposition: The tether of the stent was left on and secured to the ventral aspect of the patient's penis. t  Instructions for removing the stent have been provided to the patient. The patient has been scheduled for followup in 6 weeks with a renal ultrasound.  [name]

## 2023-11-23 NOTE — Transfer of Care (Signed)
 Immediate Anesthesia Transfer of Care Note  Patient: Craig [name]  Procedure(s) Performed: CYSTOSCOPY/URETEROSCOPY/HOLMIUM LASER/STENT PLACEMENT (Left) CYSTOSCOPY, WITH RETROGRADE PYELOGRAM (Left)  Patient Location: PACU  Anesthesia Type:General  Level of Consciousness: awake, alert , and oriented  Airway & Oxygen Therapy: Patient Spontanous Breathing  Post-op Assessment: Report given to RN and Post -op Vital signs reviewed and stable  Post vital signs: Reviewed and stable  Last Vitals:  Vitals Value Taken Time  BP 168/102 11/23/23 17:15  Temp    Pulse 76 11/23/23 17:16  Resp 11 11/23/23 17:16  SpO2 97 % 11/23/23 17:16  Vitals shown include unfiled device data.  Last Pain:  Vitals:   11/23/23 1358  TempSrc:   PainSc: 0-No pain         Complications: No notable events documented.

## 2023-11-24 ENCOUNTER — Encounter (HOSPITAL_COMMUNITY): Payer: Self-pay | Admitting: Urology

## 2023-11-26 DIAGNOSIS — N400 Enlarged prostate without lower urinary tract symptoms: Secondary | ICD-10-CM | POA: Diagnosis not present

## 2023-11-26 DIAGNOSIS — E785 Hyperlipidemia, unspecified: Secondary | ICD-10-CM | POA: Diagnosis not present

## 2023-11-30 DIAGNOSIS — N132 Hydronephrosis with renal and ureteral calculous obstruction: Secondary | ICD-10-CM | POA: Diagnosis not present

## 2023-11-30 DIAGNOSIS — N201 Calculus of ureter: Secondary | ICD-10-CM | POA: Diagnosis not present

## 2023-12-05 DIAGNOSIS — Z79899 Other long term (current) drug therapy: Secondary | ICD-10-CM | POA: Diagnosis not present

## 2023-12-05 DIAGNOSIS — Z6827 Body mass index (BMI) 27.0-27.9, adult: Secondary | ICD-10-CM | POA: Diagnosis not present

## 2023-12-05 DIAGNOSIS — M545 Low back pain, unspecified: Secondary | ICD-10-CM | POA: Diagnosis not present

## 2023-12-05 DIAGNOSIS — G894 Chronic pain syndrome: Secondary | ICD-10-CM | POA: Diagnosis not present

## 2023-12-05 DIAGNOSIS — M79604 Pain in right leg: Secondary | ICD-10-CM | POA: Diagnosis not present

## 2023-12-05 DIAGNOSIS — M546 Pain in thoracic spine: Secondary | ICD-10-CM | POA: Diagnosis not present

## 2023-12-05 DIAGNOSIS — M542 Cervicalgia: Secondary | ICD-10-CM | POA: Diagnosis not present

## 2023-12-05 DIAGNOSIS — G4730 Sleep apnea, unspecified: Secondary | ICD-10-CM | POA: Diagnosis not present

## 2023-12-08 DIAGNOSIS — H0102B Squamous blepharitis left eye, upper and lower eyelids: Secondary | ICD-10-CM | POA: Diagnosis not present

## 2023-12-08 DIAGNOSIS — H11153 Pinguecula, bilateral: Secondary | ICD-10-CM | POA: Diagnosis not present

## 2023-12-08 DIAGNOSIS — H0102A Squamous blepharitis right eye, upper and lower eyelids: Secondary | ICD-10-CM | POA: Diagnosis not present

## 2023-12-08 DIAGNOSIS — Z79899 Other long term (current) drug therapy: Secondary | ICD-10-CM | POA: Diagnosis not present

## 2023-12-08 DIAGNOSIS — H25813 Combined forms of age-related cataract, bilateral: Secondary | ICD-10-CM | POA: Diagnosis not present

## 2023-12-12 DIAGNOSIS — M47816 Spondylosis without myelopathy or radiculopathy, lumbar region: Secondary | ICD-10-CM | POA: Diagnosis not present

## 2023-12-19 ENCOUNTER — Ambulatory Visit: Admitting: Physician Assistant

## 2023-12-26 DIAGNOSIS — E785 Hyperlipidemia, unspecified: Secondary | ICD-10-CM | POA: Diagnosis not present

## 2023-12-26 DIAGNOSIS — N400 Enlarged prostate without lower urinary tract symptoms: Secondary | ICD-10-CM | POA: Diagnosis not present

## 2023-12-27 ENCOUNTER — Encounter: Payer: Self-pay | Admitting: Emergency Medicine

## 2023-12-27 ENCOUNTER — Ambulatory Visit: Attending: Internal Medicine | Admitting: Emergency Medicine

## 2023-12-27 VITALS — BP 110/72 | HR 70 | Ht 69.0 in | Wt 191.4 lb

## 2023-12-27 DIAGNOSIS — E785 Hyperlipidemia, unspecified: Secondary | ICD-10-CM | POA: Insufficient documentation

## 2023-12-27 DIAGNOSIS — I2510 Atherosclerotic heart disease of native coronary artery without angina pectoris: Secondary | ICD-10-CM | POA: Diagnosis not present

## 2023-12-27 DIAGNOSIS — I82409 Acute embolism and thrombosis of unspecified deep veins of unspecified lower extremity: Secondary | ICD-10-CM | POA: Diagnosis not present

## 2023-12-27 DIAGNOSIS — I951 Orthostatic hypotension: Secondary | ICD-10-CM | POA: Diagnosis not present

## 2023-12-27 NOTE — Patient Instructions (Signed)
 Medication Instructions:  Your physician recommends that you continue on your current medications as directed. Please refer to the Current Medication list given to you today.  *If you need a refill on your cardiac medications before your next appointment, please call your pharmacy*  Lab Work: COME BACK IN A FEW DAYS, FASTING, FOR:  LIPID & LFT  If you have labs (blood work) drawn today and your tests are completely normal, you will receive your results only by: MyChart Message (if you have MyChart) OR A paper copy in the mail If you have any lab test that is abnormal or we need to change your treatment, we will call you to review the results.  Testing/Procedures: None ordered  Follow-Up: At [facility], you and your health needs are our priority.  As part of our continuing mission to provide you with exceptional heart care, our providers are all part of one team.  This team includes your primary Cardiologist (physician) and Advanced Practice Providers or APPs (Physician Assistants and Nurse Practitioners) who all work together to provide you with the care you need, when you need it.  Your next appointment:   1 year(s)  Provider:   Wilbert Bihari, MD    We recommend signing up for the patient portal called MyChart.  Sign up information is provided on this After Visit Summary.  MyChart is used to connect with patients for Virtual Visits (Telemedicine).  Patients are able to view lab/test results, encounter notes, upcoming appointments, etc.  Non-urgent messages can be sent to your provider as well.   To learn more about what you can do with MyChart, go to [email or web address].   Other Instructions

## 2023-12-27 NOTE — Progress Notes (Unsigned)
 Cardiology Office Note:    Date:  12/27/2023   ID:  Craig [name], DOB 12/20/1950, MRN [id number]  PCP:  Kip Righter, MD   [facility] Providers Cardiologist:  Wilbert Bihari, MD { Click to update primary MD,subspecialty MD or APP then REFRESH:1}    Referring MD: Kip Righter, MD   Chief complaint: Annual follow-up  History of Present Illness:    Craig [name] is a 73 y.o. 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  Originally seen by cardiology services with Dr. Randine Bihari on 12/17/2019 for evaluation for dizziness and presyncope.  Patient reported his symptoms had improved since being evaluated at his PCPs office, believed dehydration may have also played a role in his symptoms.  Coronary CTA was ordered.  CTA 12/30/2019 shows coronary calcium  score 158, 53rd percentile for age and sex matched control.  Mild atherosclerosis of the LAD and RCA.  CAD RADS 2.  Study was submitted for FFR, which demonstrated no flow-limiting lesions.  Most recently seen in our office on 12/13/2022 by Glendia Ferrier, symptoms are relatively unchanged.  Advised to hydrate and follow-up with Dr. Bihari in 1 year.  Presents to clinic today doing well. He denies chest pain, palpitations, dyspnea, orthopnea, n, v, dizziness, syncope, edema, weight gain/loss, dark/tarry/bloody stools.  Does have a history of kidney stones, occasionally has hematuria, is following up with urology on this, last seen 1 month prior.  States he rides an elliptical machine 1 hour a day X 3 days/week, remains very active.  Tries to eat healthy, has cut back on his sugar and potato chips.  States he has not had any episodes of dizziness/presyncope since visiting with Dr. Bihari 2021.  Past Medical History:  Diagnosis Date   Anticoagulant long-term use     xarelto -- managed by pcp   Arthritis    BPH associated with nocturia    CAD (coronary artery disease) 2021   cardiologist--- dr t. turner;   CCTA 12-30-2019  score 158;  mild and  nonobstructive involving RCA/ LAD/ RI   Cervical radicular pain    DDD (degenerative disc disease), thoracolumbar    Elevated glucose    Family history of adverse reaction to anesthesia    daughter--- ponv   Hematuria    History of DVT of lower extremity    per pt LLE  2007 and recurrent 2016  LLE  (01-31-2023  when asked pt does not remember every being work-up for blood clotting disorders)   History of kidney stones    History of prostatitis    Hyperlipidemia LDL goal <70 12/13/2022   Multiple lung nodules on CT    pulmonolgy--- dr neda;  stated not malig , due to inflammatory process   Orthostatic intolerance    OSA (obstructive sleep apnea)    01-31-2023 per pt using oral device   Peripheral vascular disease    Pneumonia    Vestibular dysfunction of both ears    due to anttiviotic , gentamycin   Wears glasses    Wears hearing aid in both ears     Past Surgical History:  Procedure Laterality Date   ANTERIOR CERVICAL DECOMP/DISCECTOMY FUSION  2010   in [place];  C5--6   CYSTOSCOPY W/ RETROGRADES Left 11/23/2023   Procedure: CYSTOSCOPY, WITH RETROGRADE PYELOGRAM;  Surgeon: Shane Steffan BROCKS, MD;  Location: [facility];  Service: Urology;  Laterality: Left;   CYSTOSCOPY W/ URETERAL STENT PLACEMENT     approx 2005   CYSTOSCOPY/URETEROSCOPY/HOLMIUM LASER/STENT PLACEMENT Left 02/03/2023   Procedure: LEFT URETEROSCOPY/LEFT RETROGRADE PYELOGRAM/ Stone removal;  Surgeon: Selma Donnice SAUNDERS, MD;  Location: [facility];  Service: Urology;  Laterality: Left;   CYSTOSCOPY/URETEROSCOPY/HOLMIUM LASER/STENT PLACEMENT Left 11/23/2023   Procedure: CYSTOSCOPY/URETEROSCOPY/HOLMIUM LASER/STENT PLACEMENT;  Surgeon: Shane Steffan BROCKS, MD;  Location: [facility];  Service: Urology;  Laterality: Left;    EXTRACORPOREAL SHOCK WAVE LITHOTRIPSY Left 12/31/2018   Procedure: EXTRACORPOREAL SHOCK WAVE LITHOTRIPSY (ESWL);  Surgeon: Nieves Donnice, MD;  Location: [facility];  Service: Urology;  Laterality: Left;   IR THORACENTESIS ASP PLEURAL SPACE W/IMG GUIDE  04/23/2018   LUMBAR DISC SURGERY  2006   L5--S1   LUMBAR FUSION  02/1999   L4--5   SHOULDER ARTHROSCOPY WITH ROTATOR CUFF REPAIR Right 09/14/2021   @[facility]  by dr dozier   TRANSURETHRAL RESECTION OF PROSTATE N/A 02/03/2023   Procedure: TRANSURETHRAL RESECTION OF THE PROSTATE (TURP);  Surgeon: Selma Donnice SAUNDERS, MD;  Location: [facility];  Service: Urology;  Laterality: N/A;    Current Medications: Current Meds  Medication Sig   atorvastatin  (LIPITOR) 10 MG tablet Take 1 tablet (10 mg total) by mouth daily.   cyclobenzaprine  (FLEXERIL ) 5 MG tablet Take 5 mg by mouth 3 (three) times daily as needed for muscle spasms.   Glucosamine HCl (GLUCOSAMINE PO) Take 1 tablet by mouth daily.   HYDROcodone -acetaminophen  (NORCO) 10-325 MG tablet Take 1 tablet by mouth 4 (four) times daily as needed for moderate pain (pain score 4-6).   phenazopyridine  (PYRIDIUM ) 200 MG tablet Take 1 tablet (200 mg total) by mouth 3 (three) times daily as needed for up to 6 doses.   rivaroxaban  (XARELTO ) 10 MG TABS tablet Take 10 mg by mouth at bedtime.     Allergies:   Gentamycin [gentamicin], Nsaids, and Vancomycin   Social History   Socioeconomic History   Marital status: Married    Spouse name: Not on file   Number of children: Not on file   Years of education: Not on file   Highest education level: Not on file  Occupational History   Not on file  Tobacco Use   Smoking status: Never   Smokeless tobacco: Never  Vaping Use   Vaping status: Never Used  Substance and Sexual Activity   Alcohol use: Yes    Alcohol/week: 3.0 standard drinks of alcohol    Types: 3 Glasses of wine per week   Drug use: Never    Comment: 01-31-2023 per pt last maijuana  1960s   Sexual activity: Not on file  Other Topics Concern   Not on file  Social History Narrative   Not on file   Social Drivers of Health   Financial Resource Strain: Not on file  Food Insecurity: Not on file  Transportation Needs: Not on file  Physical Activity: Not on file  Stress: Not on file  Social Connections: Not on file     Family History: The patient's family history includes Colon cancer in his mother.  ROS:   Please see the history of present illness.    All other systems reviewed and are negative.  EKGs/Labs/Other Studies Reviewed:    The following studies were reviewed today:  EKG Interpretation Date/Time:  Wednesday December 27 2023 13:32:38 EDT Ventricular Rate:  79 PR Interval:  156 QRS Duration:  90 QT Interval:  346 QTC Calculation: 396 R Axis:  65  Text Interpretation: Normal sinus rhythm Normal ECG Confirmed by Christopher Glasscock [phone]) on 12/27/2023 2:13:52 PM    Recent Labs: 11/21/2023: BUN 17; Creatinine, Ser 0.99; Hemoglobin 15.3; Platelets 246; Potassium 4.6; Sodium 137  Recent Lipid Panel No results found for: CHOL, TRIG, HDL, CHOLHDL, VLDL, LDLCALC, LDLDIRECT    Physical Exam:    VS:  BP 110/72   Pulse 70   Ht 5' 9 (1.753 m)   Wt 191 lb 6.4 oz (86.8 kg)   SpO2 96%   BMI 28.26 kg/m     Wt Readings from Last 3 Encounters:  12/27/23 191 lb 6.4 oz (86.8 kg)  11/23/23 190 lb (86.2 kg)  11/21/23 189 lb (85.7 kg)     GEN:  Well nourished, well developed in no acute distress HEENT: Normal NECK:  No carotid bruits CARDIAC: RRR, no murmurs, rubs, gallops RESPIRATORY:  Clear to auscultation without rales, wheezing or rhonchi  ABDOMEN: Soft, non-tender, non-distended MUSCULOSKELETAL:  No edema; No deformity, DP/PT 2+ bilaterally SKIN: Warm and dry NEUROLOGIC:  Alert and oriented x 3 PSYCHIATRIC:  Normal affect   ASSESSMENT:    1. Coronary artery disease involving native coronary artery of native heart, unspecified  whether angina present   2. Hyperlipidemia, unspecified hyperlipidemia type   3. Orthostatic intolerance   4. Recurrent deep vein thrombosis (DVT) (HCC)    PLAN:    In order of problems listed above:  Coronary artery disease Mild to moderate non-obstructive CAD on coronary CTA in 2021  EKG: NSR, 79 bpm Denies chest pain, shortness of breath, DOE, orthopnea, palpitations, lightheadedness, edema Continue Xarelto  10 mg tablet at bedtime Continue Lipitor 10 mg daily  Hyperlipidemia, LDL goal <70 LDL 51 in April 2024 Will repeat fasting lipids LFTs Continue Lipitor 10 mg daily  Orthostatic intolerance Denies dizziness/presyncope since seeing Dr. Shlomo in 2021  Recurrent deep vein thrombosis On chronic anticoagulation with Xarelto  Managed by PCP  Follow-up in 1 year or sooner if new symptoms occur   Medication Adjustments/Labs and Tests Ordered: Current medicines are reviewed at length with the patient today.  Concerns regarding medicines are outlined above.  Orders Placed This Encounter  Procedures   Lipid Profile   Hepatic function panel   EKG 12-Lead   No orders of the defined types were placed in this encounter.   Patient Instructions  Medication Instructions:  Your physician recommends that you continue on your current medications as directed. Please refer to the Current Medication list given to you today.  *If you need a refill on your cardiac medications before your next appointment, please call your pharmacy*  Lab Work: COME BACK IN A FEW DAYS, FASTING, FOR:  LIPID & LFT  If you have labs (blood work) drawn today and your tests are completely normal, you will receive your results only by: MyChart Message (if you have MyChart) OR A paper copy in the mail If you have any lab test that is abnormal or we need to change your treatment, we will call you to review the results.  Testing/Procedures: None ordered  Follow-Up: At [facility], you and your  health needs are our priority.  As part of our continuing mission to provide you with exceptional heart care, our providers are all part of one team.  This team includes your primary Cardiologist (physician) and Advanced Practice Providers or APPs (Physician Assistants and Nurse Practitioners) who all work together to provide you with the care you need, when you need it.  Your  next appointment:   1 year(s)  Provider:   Wilbert Bihari, MD    We recommend signing up for the patient portal called MyChart.  Sign up information is provided on this After Visit Summary.  MyChart is used to connect with patients for Virtual Visits (Telemedicine).  Patients are able to view lab/test results, encounter notes, upcoming appointments, etc.  Non-urgent messages can be sent to your provider as well.   To learn more about what you can do with MyChart, go to [email or web address].   Other Instructions            Signed, Miriam FORBES Shams, NP  12/27/2023 2:25 PM    [facility]

## 2023-12-28 ENCOUNTER — Encounter: Payer: Self-pay | Admitting: Emergency Medicine

## 2024-01-02 DIAGNOSIS — N200 Calculus of kidney: Secondary | ICD-10-CM | POA: Diagnosis not present

## 2024-01-02 DIAGNOSIS — C61 Malignant neoplasm of prostate: Secondary | ICD-10-CM | POA: Diagnosis not present

## 2024-01-04 DIAGNOSIS — G894 Chronic pain syndrome: Secondary | ICD-10-CM | POA: Diagnosis not present

## 2024-01-04 DIAGNOSIS — M542 Cervicalgia: Secondary | ICD-10-CM | POA: Diagnosis not present

## 2024-01-04 DIAGNOSIS — Z79899 Other long term (current) drug therapy: Secondary | ICD-10-CM | POA: Diagnosis not present

## 2024-01-04 DIAGNOSIS — M79604 Pain in right leg: Secondary | ICD-10-CM | POA: Diagnosis not present

## 2024-01-04 DIAGNOSIS — Z6827 Body mass index (BMI) 27.0-27.9, adult: Secondary | ICD-10-CM | POA: Diagnosis not present

## 2024-01-04 DIAGNOSIS — M545 Low back pain, unspecified: Secondary | ICD-10-CM | POA: Diagnosis not present

## 2024-01-04 DIAGNOSIS — Z0000 Encounter for general adult medical examination without abnormal findings: Secondary | ICD-10-CM | POA: Diagnosis not present

## 2024-01-04 DIAGNOSIS — M546 Pain in thoracic spine: Secondary | ICD-10-CM | POA: Diagnosis not present

## 2024-01-09 ENCOUNTER — Encounter: Payer: Self-pay | Admitting: Pulmonary Disease

## 2024-01-09 ENCOUNTER — Ambulatory Visit (INDEPENDENT_AMBULATORY_CARE_PROVIDER_SITE_OTHER): Admitting: Pulmonary Disease

## 2024-01-09 VITALS — BP 131/70 | HR 71 | Ht 69.0 in | Wt 190.0 lb

## 2024-01-09 DIAGNOSIS — Z79899 Other long term (current) drug therapy: Secondary | ICD-10-CM | POA: Diagnosis not present

## 2024-01-09 DIAGNOSIS — R911 Solitary pulmonary nodule: Secondary | ICD-10-CM | POA: Diagnosis not present

## 2024-01-09 NOTE — Patient Instructions (Signed)
 Lets repeat your CT scan in about 6 months - This is to follow-up on the small new spot in the left lung - The previous spots in the lungs did disappear -If following the next CAT scan we do not see any new spots then we can hold off on repeating scans for a couple years  No changes to what you are doing on a day-to-day basis  Call us  with significant concerns  Follow-up in 6 months

## 2024-01-09 NOTE — Progress Notes (Signed)
 Craig [name]    [id number]    Feb 10, 1951  Primary Care Physician:Morrow, Beverley, MD  Referring Physician: Kip Beverley, MD [address] [place],  [place] [address]  Chief complaint:   Being seen for an abnormal CT scan of the chest  HPI:  In today for follow-up of CT  Has been feeling relatively well with no significant issues Had a repeat CT for fleetingly lung nodules He does have some scarring at the bases of the lungs  Most current CT scan reviewed with the patient during the office visit today showing a new small nodule at the left base Previous nodule did disappear  Continues to feel generally well Active  Denies any significant symptoms No shortness of breath with activity Weight is controlled  Never smoker  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   cyclobenzaprine  (FLEXERIL ) 5 MG tablet Take 5 mg by mouth 3 (three) times daily as needed for muscle spasms.   Glucosamine HCl (GLUCOSAMINE PO) Take 1 tablet by mouth daily.   rivaroxaban  (XARELTO ) 10 MG TABS tablet Take 10 mg by mouth at bedtime.   HYDROcodone -acetaminophen  (NORCO) 10-325 MG tablet Take 1 tablet by mouth 4 (four) times daily as needed for moderate pain (pain score 4-6).   phenazopyridine  (PYRIDIUM ) 200 MG tablet Take 1 tablet (200 mg total) by mouth 3 (three) times daily as needed for up to 6 doses.   No facility-administered encounter medications on file as of 01/09/2024.    Allergies as of 01/09/2024 - Review Complete 01/09/2024  Allergen Reaction Noted   Gentamycin [gentamicin] Other (See Comments) 03/23/2018   Nsaids Other (See Comments) 03/23/2018   Vancomycin Rash 03/23/2018    Past Medical History:  Diagnosis Date   Anticoagulant long-term use    xarelto -- managed by pcp   Arthritis    BPH associated  with nocturia    CAD (coronary artery disease) 2021   cardiologist--- dr t. turner;   CCTA 12-30-2019  score 158;  mild and  nonobstructive involving RCA/ LAD/ RI   Cervical radicular pain    DDD (degenerative disc disease), thoracolumbar    Elevated glucose    Family history of adverse reaction to anesthesia    daughter--- ponv   Hematuria    History of DVT of lower extremity    per pt LLE  2007 and recurrent 2016  LLE  (01-31-2023  when asked pt does not remember every being work-up for blood clotting disorders)   History of kidney stones    History of prostatitis    Hyperlipidemia LDL goal <70 12/13/2022   Multiple lung nodules on CT    pulmonolgy--- dr neda;  stated not malig , due to inflammatory process   Orthostatic intolerance    OSA (obstructive sleep apnea)    01-31-2023 per pt using oral device   Peripheral vascular disease    Pneumonia    Vestibular dysfunction of both ears    due to anttiviotic , gentamycin   Wears glasses    Wears hearing aid in both ears     Past Surgical History:  Procedure Laterality Date   ANTERIOR CERVICAL DECOMP/DISCECTOMY FUSION  2010   in [place];  C5--6   CYSTOSCOPY W/ RETROGRADES Left 11/23/2023   Procedure: CYSTOSCOPY, WITH RETROGRADE  PYELOGRAM;  Surgeon: Shane Steffan BROCKS, MD;  Location: [facility];  Service: Urology;  Laterality: Left;   CYSTOSCOPY W/ URETERAL STENT PLACEMENT     approx 2005   CYSTOSCOPY/URETEROSCOPY/HOLMIUM LASER/STENT PLACEMENT Left 02/03/2023   Procedure: LEFT URETEROSCOPY/LEFT RETROGRADE PYELOGRAM/ Stone removal;  Surgeon: Selma Donnice SAUNDERS, MD;  Location: [facility];  Service: Urology;  Laterality: Left;   CYSTOSCOPY/URETEROSCOPY/HOLMIUM LASER/STENT PLACEMENT Left 11/23/2023   Procedure: CYSTOSCOPY/URETEROSCOPY/HOLMIUM LASER/STENT PLACEMENT;  Surgeon: Shane Steffan BROCKS, MD;  Location: [facility];  Service: Urology;  Laterality: Left;   EXTRACORPOREAL SHOCK WAVE LITHOTRIPSY Left 12/31/2018    Procedure: EXTRACORPOREAL SHOCK WAVE LITHOTRIPSY (ESWL);  Surgeon: Nieves Donnice, MD;  Location: [facility];  Service: Urology;  Laterality: Left;   IR THORACENTESIS ASP PLEURAL SPACE W/IMG GUIDE  04/23/2018   LUMBAR DISC SURGERY  2006   L5--S1   LUMBAR FUSION  02/1999   L4--5   SHOULDER ARTHROSCOPY WITH ROTATOR CUFF REPAIR Right 09/14/2021   @[facility]  by dr dozier   TRANSURETHRAL RESECTION OF PROSTATE N/A 02/03/2023   Procedure: TRANSURETHRAL RESECTION OF THE PROSTATE (TURP);  Surgeon: Selma Donnice SAUNDERS, MD;  Location: [facility];  Service: Urology;  Laterality: N/A;    Family History  Problem Relation Age of Onset   Colon cancer Mother     Social History   Socioeconomic History   Marital status: Married    Spouse name: Not on file   Number of children: Not on file   Years of education: Not on file   Highest education level: Not on file  Occupational History   Not on file  Tobacco Use   Smoking status: Never   Smokeless tobacco: Never  Vaping Use   Vaping status: Never Used  Substance and Sexual Activity   Alcohol use: Yes    Alcohol/week: 3.0 standard drinks of alcohol    Types: 3 Glasses of wine per week   Drug use: Never    Comment: 01-31-2023 per pt last maijuana 1960s   Sexual activity: Not on file  Other Topics Concern   Not on file  Social History Narrative   Not on file   Social Drivers of Health   Financial Resource Strain: Not on file  Food Insecurity: Not on file  Transportation Needs: Not on file  Physical Activity: Not on file  Stress: Not on file  Social Connections: Not on file  Intimate Partner Violence: Not on file    Review of Systems  Respiratory:  Negative for cough and shortness of breath.     Vitals:   01/09/24 1007  BP: 131/70  Pulse: 71  SpO2: 95%     Physical Exam Constitutional:      Appearance: Normal appearance.  HENT:     Head: Normocephalic.     Mouth/Throat:     Mouth: Mucous membranes are moist.  Eyes:      General: No scleral icterus. Cardiovascular:     Rate and Rhythm: Normal rate and regular rhythm.     Heart sounds: No murmur heard.    No friction rub.  Pulmonary:     Effort: No respiratory distress.     Breath sounds: No stridor. No wheezing or rhonchi.  Musculoskeletal:     Cervical back: No rigidity or tenderness.  Neurological:     Mental Status: He is alert.  Psychiatric:        Mood and Affect: Mood normal.    Data Reviewed: Previous CT from 2020 reviewed  Multiple CTs  completed in the office today including the 1 from 05/10/2022, 12/30/2019, 04/03/2019-fleeting nodularity  Most recent CT from September 2025 reviewed with patient compared with previous  Assessment:  Lung nodules - Likely inflammatory less likely infectious etiology  Some scarring at the lung bases  History of obstructive sleep apnea - Wears a dental device  Plan/Recommendations: Repeat CT scan of the chest in 6 months  Encouraged to call with significant concerns  Continue oral device for sleep apnea  Call us  with significant concerns   Jennet Epley MD [facility] 01/09/2024, 10:16 AM  CC: Kip Righter, MD

## 2024-01-12 DIAGNOSIS — Z23 Encounter for immunization: Secondary | ICD-10-CM | POA: Diagnosis not present

## 2024-01-25 DIAGNOSIS — R7309 Other abnormal glucose: Secondary | ICD-10-CM | POA: Diagnosis not present

## 2024-01-25 DIAGNOSIS — E785 Hyperlipidemia, unspecified: Secondary | ICD-10-CM | POA: Diagnosis not present

## 2024-01-25 DIAGNOSIS — N400 Enlarged prostate without lower urinary tract symptoms: Secondary | ICD-10-CM | POA: Diagnosis not present

## 2024-01-25 DIAGNOSIS — Z86718 Personal history of other venous thrombosis and embolism: Secondary | ICD-10-CM | POA: Diagnosis not present

## 2024-01-26 DIAGNOSIS — N400 Enlarged prostate without lower urinary tract symptoms: Secondary | ICD-10-CM | POA: Diagnosis not present

## 2024-01-26 DIAGNOSIS — E785 Hyperlipidemia, unspecified: Secondary | ICD-10-CM | POA: Diagnosis not present

## 2024-02-06 DIAGNOSIS — M546 Pain in thoracic spine: Secondary | ICD-10-CM | POA: Diagnosis not present

## 2024-02-06 DIAGNOSIS — Z6827 Body mass index (BMI) 27.0-27.9, adult: Secondary | ICD-10-CM | POA: Diagnosis not present

## 2024-02-06 DIAGNOSIS — M545 Low back pain, unspecified: Secondary | ICD-10-CM | POA: Diagnosis not present

## 2024-02-06 DIAGNOSIS — M79604 Pain in right leg: Secondary | ICD-10-CM | POA: Diagnosis not present

## 2024-02-06 DIAGNOSIS — M542 Cervicalgia: Secondary | ICD-10-CM | POA: Diagnosis not present

## 2024-02-06 DIAGNOSIS — G4730 Sleep apnea, unspecified: Secondary | ICD-10-CM | POA: Diagnosis not present

## 2024-02-06 DIAGNOSIS — G894 Chronic pain syndrome: Secondary | ICD-10-CM | POA: Diagnosis not present

## 2024-02-06 DIAGNOSIS — Z79899 Other long term (current) drug therapy: Secondary | ICD-10-CM | POA: Diagnosis not present

## 2024-02-09 DIAGNOSIS — Z79899 Other long term (current) drug therapy: Secondary | ICD-10-CM | POA: Diagnosis not present

## 2024-02-25 DIAGNOSIS — N400 Enlarged prostate without lower urinary tract symptoms: Secondary | ICD-10-CM | POA: Diagnosis not present

## 2024-02-25 DIAGNOSIS — E785 Hyperlipidemia, unspecified: Secondary | ICD-10-CM | POA: Diagnosis not present

## 2024-02-27 DIAGNOSIS — C61 Malignant neoplasm of prostate: Secondary | ICD-10-CM | POA: Diagnosis not present

## 2024-03-05 DIAGNOSIS — R3914 Feeling of incomplete bladder emptying: Secondary | ICD-10-CM | POA: Diagnosis not present

## 2024-03-05 DIAGNOSIS — N401 Enlarged prostate with lower urinary tract symptoms: Secondary | ICD-10-CM | POA: Diagnosis not present

## 2024-03-05 DIAGNOSIS — M79604 Pain in right leg: Secondary | ICD-10-CM | POA: Diagnosis not present

## 2024-03-05 DIAGNOSIS — N200 Calculus of kidney: Secondary | ICD-10-CM | POA: Diagnosis not present

## 2024-03-05 DIAGNOSIS — G4730 Sleep apnea, unspecified: Secondary | ICD-10-CM | POA: Diagnosis not present

## 2024-03-05 DIAGNOSIS — G894 Chronic pain syndrome: Secondary | ICD-10-CM | POA: Diagnosis not present

## 2024-03-05 DIAGNOSIS — M542 Cervicalgia: Secondary | ICD-10-CM | POA: Diagnosis not present

## 2024-03-05 DIAGNOSIS — C61 Malignant neoplasm of prostate: Secondary | ICD-10-CM | POA: Diagnosis not present

## 2024-03-05 DIAGNOSIS — R351 Nocturia: Secondary | ICD-10-CM | POA: Diagnosis not present

## 2024-03-05 DIAGNOSIS — Z79899 Other long term (current) drug therapy: Secondary | ICD-10-CM | POA: Diagnosis not present

## 2024-03-05 DIAGNOSIS — R350 Frequency of micturition: Secondary | ICD-10-CM | POA: Diagnosis not present

## 2024-03-05 DIAGNOSIS — N202 Calculus of kidney with calculus of ureter: Secondary | ICD-10-CM | POA: Diagnosis not present

## 2024-03-05 DIAGNOSIS — M545 Low back pain, unspecified: Secondary | ICD-10-CM | POA: Diagnosis not present

## 2024-03-05 DIAGNOSIS — Z6827 Body mass index (BMI) 27.0-27.9, adult: Secondary | ICD-10-CM | POA: Diagnosis not present

## 2024-03-05 DIAGNOSIS — M546 Pain in thoracic spine: Secondary | ICD-10-CM | POA: Diagnosis not present

## 2024-03-08 DIAGNOSIS — Z79899 Other long term (current) drug therapy: Secondary | ICD-10-CM | POA: Diagnosis not present

## 2024-05-09 ENCOUNTER — Other Ambulatory Visit
